# Patient Record
Sex: Female | Born: 1945 | Race: White | Hispanic: No | Smoking: Never smoker
Health system: Southern US, Community
[De-identification: ages and names within clinical notes are randomized; demographics above are authoritative.]

## PROBLEM LIST (undated history)

## (undated) DIAGNOSIS — I10 Essential (primary) hypertension: Secondary | ICD-10-CM

## (undated) DIAGNOSIS — I350 Nonrheumatic aortic (valve) stenosis: Secondary | ICD-10-CM

## (undated) DIAGNOSIS — I480 Paroxysmal atrial fibrillation: Secondary | ICD-10-CM

## (undated) DIAGNOSIS — I499 Cardiac arrhythmia, unspecified: Secondary | ICD-10-CM

## (undated) DIAGNOSIS — Z8673 Personal history of transient ischemic attack (TIA), and cerebral infarction without residual deficits: Secondary | ICD-10-CM

## (undated) DIAGNOSIS — R011 Cardiac murmur, unspecified: Secondary | ICD-10-CM

## (undated) DIAGNOSIS — G3184 Mild cognitive impairment, so stated: Secondary | ICD-10-CM

## (undated) HISTORY — PX: BREAST BIOPSY: SHX20

## (undated) HISTORY — PX: STOMACH SURGERY: SHX791

## (undated) HISTORY — PX: HAND SURGERY: SHX662

## (undated) HISTORY — PX: TONSILLECTOMY: SUR1361

## (undated) HISTORY — PX: ABDOMINAL HYSTERECTOMY: SHX81

---

## 1997-08-21 ENCOUNTER — Other Ambulatory Visit: Admission: RE | Admit: 1997-08-21 | Discharge: 1997-08-21 | Payer: Self-pay | Admitting: *Deleted

## 1997-09-04 ENCOUNTER — Ambulatory Visit (HOSPITAL_COMMUNITY): Admission: RE | Admit: 1997-09-04 | Discharge: 1997-09-04 | Payer: Self-pay | Admitting: *Deleted

## 1998-02-23 ENCOUNTER — Emergency Department (HOSPITAL_COMMUNITY): Admission: EM | Admit: 1998-02-23 | Discharge: 1998-02-24 | Payer: Self-pay | Admitting: Emergency Medicine

## 1999-12-11 ENCOUNTER — Ambulatory Visit (HOSPITAL_COMMUNITY): Admission: RE | Admit: 1999-12-11 | Discharge: 1999-12-11 | Payer: Self-pay | Admitting: *Deleted

## 1999-12-11 ENCOUNTER — Encounter: Payer: Self-pay | Admitting: *Deleted

## 2000-11-09 ENCOUNTER — Encounter (INDEPENDENT_AMBULATORY_CARE_PROVIDER_SITE_OTHER): Payer: Self-pay | Admitting: Specialist

## 2000-11-09 ENCOUNTER — Ambulatory Visit (HOSPITAL_COMMUNITY): Admission: RE | Admit: 2000-11-09 | Discharge: 2000-11-09 | Payer: Self-pay | Admitting: Gastroenterology

## 2004-02-23 ENCOUNTER — Ambulatory Visit (HOSPITAL_COMMUNITY): Admission: RE | Admit: 2004-02-23 | Discharge: 2004-02-23 | Payer: Self-pay | Admitting: Internal Medicine

## 2004-04-08 ENCOUNTER — Encounter: Admission: RE | Admit: 2004-04-08 | Discharge: 2004-04-08 | Payer: Self-pay | Admitting: Internal Medicine

## 2004-07-18 ENCOUNTER — Encounter: Admission: RE | Admit: 2004-07-18 | Discharge: 2004-07-18 | Payer: Self-pay | Admitting: Internal Medicine

## 2005-04-03 ENCOUNTER — Ambulatory Visit (HOSPITAL_COMMUNITY): Admission: RE | Admit: 2005-04-03 | Discharge: 2005-04-03 | Payer: Self-pay | Admitting: Internal Medicine

## 2006-05-08 ENCOUNTER — Ambulatory Visit (HOSPITAL_COMMUNITY): Admission: RE | Admit: 2006-05-08 | Discharge: 2006-05-08 | Payer: Self-pay | Admitting: Internal Medicine

## 2007-07-27 ENCOUNTER — Ambulatory Visit (HOSPITAL_COMMUNITY): Admission: RE | Admit: 2007-07-27 | Discharge: 2007-07-27 | Payer: Self-pay | Admitting: Internal Medicine

## 2009-02-13 ENCOUNTER — Encounter: Admission: RE | Admit: 2009-02-13 | Discharge: 2009-02-13 | Payer: Self-pay | Admitting: Family Medicine

## 2009-04-09 ENCOUNTER — Encounter: Admission: RE | Admit: 2009-04-09 | Discharge: 2009-04-09 | Payer: Self-pay | Admitting: Internal Medicine

## 2009-09-06 ENCOUNTER — Ambulatory Visit (HOSPITAL_COMMUNITY): Admission: RE | Admit: 2009-09-06 | Discharge: 2009-09-06 | Payer: Self-pay | Admitting: Internal Medicine

## 2009-10-10 ENCOUNTER — Encounter: Admission: RE | Admit: 2009-10-10 | Discharge: 2009-10-10 | Payer: Self-pay | Admitting: Internal Medicine

## 2010-06-14 NOTE — Procedures (Signed)
[facility]  Patient:    Julia Bowman, Julia Bowman Visit Number: [id number] MRN: [id number]          Service Type: Attending:  Verlin Grills, M.D. Proc. Date: 11/09/00   CC:         Thora Lance, M.D.   Procedure Report  PROCEDURE:  Colonoscopy with polypectomy.  REFERRING PHYSICIAN:  Thora Lance, M.D.  PROCEDURE INDICATION:  Ms. Julia Bowman (date of birth Jun 01, 2045 ) is a 65 year old female.  She is referred for her first surveillance colonoscopy with polypectomy to prevent colon cancer.  Her mother was diagnoses with colon cancer in her late 83s.  I discussed with Ms. Julia Bowman the complications associated with colonoscopy and polypectomy including a 15 per 1000 risk of bleeding and 4 per 1000 risk of colon perforation requiring surgical repair.  Ms. Julia Bowman has signed the operative permit.  ENDOSCOPIST:  Verlin Grills, M.D.  PREMEDICATION:  Versed 5 mg, Demerol 70 mg.  ENDOSCOPE:  Olympus pediatric colonoscope.  DESCRIPTION OF PROCEDURE:  After obtaining informed consent, Ms. Julia Bowman was placed in the left lateral decubitus position.  I administered intravenous Versed and intravenous Demerol to achieve conscious sedation for the procedure.  The patients blood pressure, oxygen saturations, and cardiac rhythm were monitored throughout the procedure and documented in the medical record.  Anal inspection was normal.  Digital rectal exam was normal.  The Olympus pediatric video colonoscope was introduced into the rectum and advanced to the mid ascending colon.  Due to colonic loop formation, I unable to intubate the cecum for examination.  Colonic preparation for the exam today was excellent.   RECTUM:  Normal.  SIGMOID COLON AND DESCENDING COLON:  At approximately 30 cm from the anal verge, a 2 mm sessile polyp was removed with the electrocautery snare and submitted for pathological interpretation.  SPLENIC FLEXURE:   Normal.  TRANSVERSE COLON:  Normal.  HEPATIC FLEXURE:  Normal.  ASCENDING COLON:  Normal.  CECUM AND ILEOCECAL VALVE:  Not examined.  ASSESSMENT: 1. Incomplete colonoscopy; due to colonic loop formation, I was unable to exam    the cecum or ileocecal valve. 2. From the distal sigmoid colon, at approximately 30 cm from the anal verge,    a 2 mm sessile polyp was removed with the electrocautery snare.  RECOMMENDATIONS:  Ms. Julia Bowman should undergo an air-contrast barium enema to exam the right colon in approximately six weeks.  She should undergo a repeat total colon examination in approximately five years. Attending:  Verlin Grills, M.D. DD:  11/09/00 TD:  11/09/00 Job: [id number]

## 2010-10-30 ENCOUNTER — Other Ambulatory Visit (HOSPITAL_COMMUNITY): Payer: Self-pay | Admitting: Internal Medicine

## 2010-10-30 DIAGNOSIS — Z1231 Encounter for screening mammogram for malignant neoplasm of breast: Secondary | ICD-10-CM

## 2010-11-14 ENCOUNTER — Ambulatory Visit (HOSPITAL_COMMUNITY)
Admission: RE | Admit: 2010-11-14 | Discharge: 2010-11-14 | Disposition: A | Discharge status: Home / Self Care | Payer: MEDICARE | Source: Ambulatory Visit | Attending: Internal Medicine | Admitting: Internal Medicine

## 2010-11-14 DIAGNOSIS — Z1231 Encounter for screening mammogram for malignant neoplasm of breast: Secondary | ICD-10-CM | POA: Insufficient documentation

## 2014-04-17 ENCOUNTER — Other Ambulatory Visit (HOSPITAL_COMMUNITY): Payer: Self-pay | Admitting: Internal Medicine

## 2014-04-17 DIAGNOSIS — Z1231 Encounter for screening mammogram for malignant neoplasm of breast: Secondary | ICD-10-CM

## 2014-04-19 ENCOUNTER — Other Ambulatory Visit: Payer: Self-pay | Admitting: Internal Medicine

## 2014-04-19 DIAGNOSIS — Z800 Family history of malignant neoplasm of digestive organs: Secondary | ICD-10-CM

## 2014-04-19 DIAGNOSIS — Z1231 Encounter for screening mammogram for malignant neoplasm of breast: Secondary | ICD-10-CM | POA: Insufficient documentation

## 2014-04-19 DIAGNOSIS — Q438 Other specified congenital malformations of intestine: Secondary | ICD-10-CM

## 2014-04-20 ENCOUNTER — Ambulatory Visit (HOSPITAL_BASED_OUTPATIENT_CLINIC_OR_DEPARTMENT_OTHER): Payer: Medicare HMO | Admitting: Radiology

## 2014-04-20 ENCOUNTER — Ambulatory Visit (HOSPITAL_COMMUNITY)
Admission: RE | Admit: 2014-04-20 | Discharge: 2014-04-20 | Disposition: A | Discharge status: Home / Self Care | Payer: Medicare HMO | Source: Ambulatory Visit | Attending: Internal Medicine | Admitting: Internal Medicine

## 2014-04-20 ENCOUNTER — Other Ambulatory Visit (HOSPITAL_COMMUNITY): Payer: Self-pay | Admitting: Internal Medicine

## 2014-04-20 DIAGNOSIS — R011 Cardiac murmur, unspecified: Secondary | ICD-10-CM | POA: Diagnosis not present

## 2014-04-20 DIAGNOSIS — Z1231 Encounter for screening mammogram for malignant neoplasm of breast: Secondary | ICD-10-CM | POA: Diagnosis not present

## 2014-04-20 NOTE — Progress Notes (Signed)
Echocardiogram performed.  

## 2014-05-01 ENCOUNTER — Ambulatory Visit
Admission: RE | Admit: 2014-05-01 | Discharge: 2014-05-01 | Disposition: A | Discharge status: Home / Self Care | Payer: Medicare HMO | Source: Ambulatory Visit | Attending: Internal Medicine | Admitting: Internal Medicine

## 2014-05-01 DIAGNOSIS — Z800 Family history of malignant neoplasm of digestive organs: Secondary | ICD-10-CM

## 2014-05-01 DIAGNOSIS — Q438 Other specified congenital malformations of intestine: Secondary | ICD-10-CM

## 2014-12-18 DIAGNOSIS — H2513 Age-related nuclear cataract, bilateral: Secondary | ICD-10-CM | POA: Diagnosis not present

## 2015-01-08 DIAGNOSIS — R933 Abnormal findings on diagnostic imaging of other parts of digestive tract: Secondary | ICD-10-CM | POA: Diagnosis not present

## 2015-01-08 DIAGNOSIS — Z800 Family history of malignant neoplasm of digestive organs: Secondary | ICD-10-CM | POA: Diagnosis not present

## 2015-01-08 DIAGNOSIS — K5900 Constipation, unspecified: Secondary | ICD-10-CM | POA: Diagnosis not present

## 2015-01-09 ENCOUNTER — Other Ambulatory Visit: Payer: Self-pay | Admitting: Gastroenterology

## 2015-01-10 ENCOUNTER — Encounter (HOSPITAL_COMMUNITY): Payer: Self-pay | Admitting: *Deleted

## 2015-01-10 NOTE — Addendum Note (Signed)
Addended by: Taneah Masri on: 01/10/2015 08:29 AM   Modules accepted: Orders  

## 2015-01-11 ENCOUNTER — Encounter (HOSPITAL_COMMUNITY): Payer: Self-pay | Admitting: *Deleted

## 2015-01-16 ENCOUNTER — Other Ambulatory Visit: Payer: Self-pay | Admitting: Gastroenterology

## 2015-01-16 NOTE — Addendum Note (Signed)
Addended by: Arta Silence on: 01/16/2015 01:13 PM   Modules accepted: Orders

## 2015-01-17 ENCOUNTER — Ambulatory Visit (HOSPITAL_COMMUNITY): Payer: Medicare HMO | Admitting: Anesthesiology

## 2015-01-17 ENCOUNTER — Encounter (HOSPITAL_COMMUNITY): Payer: Self-pay

## 2015-01-17 ENCOUNTER — Ambulatory Visit (HOSPITAL_COMMUNITY)
Admission: RE | Admit: 2015-01-17 | Discharge: 2015-01-17 | Disposition: A | Discharge status: Home / Self Care | Payer: Medicare HMO | Source: Ambulatory Visit | Attending: Gastroenterology | Admitting: Gastroenterology

## 2015-01-17 ENCOUNTER — Encounter (HOSPITAL_COMMUNITY)
Admission: RE | Disposition: A | Discharge status: Home / Self Care | Payer: Self-pay | Source: Ambulatory Visit | Attending: Gastroenterology

## 2015-01-17 DIAGNOSIS — I10 Essential (primary) hypertension: Secondary | ICD-10-CM | POA: Insufficient documentation

## 2015-01-17 DIAGNOSIS — Z86010 Personal history of colonic polyps: Secondary | ICD-10-CM | POA: Diagnosis not present

## 2015-01-17 DIAGNOSIS — E785 Hyperlipidemia, unspecified: Secondary | ICD-10-CM | POA: Diagnosis not present

## 2015-01-17 DIAGNOSIS — K644 Residual hemorrhoidal skin tags: Secondary | ICD-10-CM | POA: Insufficient documentation

## 2015-01-17 DIAGNOSIS — R933 Abnormal findings on diagnostic imaging of other parts of digestive tract: Secondary | ICD-10-CM | POA: Diagnosis not present

## 2015-01-17 DIAGNOSIS — Q438 Other specified congenital malformations of intestine: Secondary | ICD-10-CM | POA: Insufficient documentation

## 2015-01-17 DIAGNOSIS — Z79899 Other long term (current) drug therapy: Secondary | ICD-10-CM | POA: Diagnosis not present

## 2015-01-17 DIAGNOSIS — D649 Anemia, unspecified: Secondary | ICD-10-CM | POA: Diagnosis not present

## 2015-01-17 DIAGNOSIS — E669 Obesity, unspecified: Secondary | ICD-10-CM | POA: Diagnosis not present

## 2015-01-17 DIAGNOSIS — M8580 Other specified disorders of bone density and structure, unspecified site: Secondary | ICD-10-CM | POA: Insufficient documentation

## 2015-01-17 DIAGNOSIS — D125 Benign neoplasm of sigmoid colon: Secondary | ICD-10-CM | POA: Diagnosis not present

## 2015-01-17 DIAGNOSIS — Z9884 Bariatric surgery status: Secondary | ICD-10-CM | POA: Diagnosis not present

## 2015-01-17 DIAGNOSIS — K5730 Diverticulosis of large intestine without perforation or abscess without bleeding: Secondary | ICD-10-CM | POA: Diagnosis not present

## 2015-01-17 DIAGNOSIS — Z6833 Body mass index (BMI) 33.0-33.9, adult: Secondary | ICD-10-CM | POA: Insufficient documentation

## 2015-01-17 DIAGNOSIS — Z800 Family history of malignant neoplasm of digestive organs: Secondary | ICD-10-CM | POA: Diagnosis not present

## 2015-01-17 DIAGNOSIS — K5900 Constipation, unspecified: Secondary | ICD-10-CM | POA: Insufficient documentation

## 2015-01-17 HISTORY — DX: Cardiac arrhythmia, unspecified: I49.9

## 2015-01-17 HISTORY — PX: COLONOSCOPY WITH PROPOFOL: SHX5780

## 2015-01-17 SURGERY — COLONOSCOPY WITH PROPOFOL
Anesthesia: Monitor Anesthesia Care

## 2015-01-17 MED ORDER — PROPOFOL 10 MG/ML IV BOLUS
INTRAVENOUS | Status: AC
  Filled 2015-01-17: qty 20

## 2015-01-17 MED ORDER — LACTATED RINGERS IV SOLN
INTRAVENOUS | Status: DC | PRN
  Administered 2015-01-17: 09:00:00 via INTRAVENOUS

## 2015-01-17 MED ORDER — SODIUM CHLORIDE 0.9 % IV SOLN: INTRAVENOUS | Status: DC

## 2015-01-17 MED ORDER — PROPOFOL 10 MG/ML IV BOLUS
INTRAVENOUS | Status: DC | PRN
  Administered 2015-01-17 (×5): 20 mg via INTRAVENOUS
  Administered 2015-01-17: 10 mg via INTRAVENOUS
  Administered 2015-01-17: 20 mg via INTRAVENOUS
  Administered 2015-01-17: 30 mg via INTRAVENOUS
  Administered 2015-01-17 (×27): 20 mg via INTRAVENOUS

## 2015-01-17 SURGICAL SUPPLY — 22 items

## 2015-01-17 NOTE — Anesthesia Preprocedure Evaluation (Signed)
Anesthesia Evaluation  Patient identified by MRN, date of birth, ID band Patient awake    Reviewed: Allergy & Precautions, NPO status , Patient's Chart, lab work & pertinent test results  Airway Mallampati: II  TM Distance: >3 FB Neck ROM: Full    Dental no notable dental hx.    Pulmonary neg pulmonary ROS,    Pulmonary exam normal breath sounds clear to auscultation       Cardiovascular negative cardio ROS Normal cardiovascular exam+ dysrhythmias  Rhythm:Regular Rate:Normal     Neuro/Psych negative neurological ROS  negative psych ROS   GI/Hepatic negative GI ROS, Neg liver ROS,   Endo/Other  negative endocrine ROS  Renal/GU negative Renal ROS  negative genitourinary   Musculoskeletal negative musculoskeletal ROS (+)   Abdominal   Peds negative pediatric ROS (+)  Hematology negative hematology ROS (+)   Anesthesia Other Findings   Reproductive/Obstetrics negative OB ROS                             Anesthesia Physical Anesthesia Plan  ASA: II  Anesthesia Plan: MAC   Post-op Pain Management:    Induction: Intravenous  Airway Management Planned: Natural Airway  Additional Equipment:   Intra-op Plan:   Post-operative Plan: Extubation in OR  Informed Consent: I have reviewed the patients History and Physical, chart, labs and discussed the procedure including the risks, benefits and alternatives for the proposed anesthesia with the patient or authorized representative who has indicated his/her understanding and acceptance.   Dental advisory given  Plan Discussed with: CRNA  Anesthesia Plan Comments:         Anesthesia Quick Evaluation

## 2015-01-17 NOTE — H&P (Signed)
Patient interval history reviewed.  Patient examined again.  There has been no change from documented H/P dated 01/08/15 (scanned into chart from our office) except as documented above.  Assessment:  1.  Abnormal CT colonography (sigmoid colon polyp; elongated and redundant sigmoid colon). 2.  Family history of colon cancer (mother). 3.  Personal history of colon polyps.  Plan:  1.  Colonoscopy (with availability of ultraslim colonoscope). 2.  Risks (bleeding, infection, bowel perforation that could require surgery, sedation-related changes in cardiopulmonary systems), benefits (identification and possible treatment of source of symptoms, exclusion of certain causes of symptoms), and alternatives (watchful waiting, radiographic imaging studies, empiric medical treatment) of colonoscopy were explained to patient/family in detail and patient wishes to proceed.

## 2015-01-17 NOTE — Transfer of Care (Signed)
Immediate Anesthesia Transfer of Care Note  Patient: Julia Bowman  Procedure(s) Performed: Procedure(s): COLONOSCOPY WITH PROPOFOL (N/A)  Patient Location: PACU  Anesthesia Type:MAC  Level of Consciousness: sedated  Airway & Oxygen Therapy: Patient Spontanous Breathing and Patient connected to nasal cannula oxygen  Post-op Assessment: Report given to RN and Post -op Vital signs reviewed and stable  Post vital signs: Reviewed and stable  Last Vitals:  Filed Vitals:   01/17/15 0913  BP: 130/99  Pulse: 79  Temp: 36.7 C  Resp: 10    Complications: No apparent anesthesia complications

## 2015-01-17 NOTE — Op Note (Addendum)
[facility] [place], [id number]   COLONOSCOPY PROCEDURE REPORT  PATIENT: Julia Bowman, Julia Bowman  MR#: [id number] BIRTHDATE: [birth year] , 47  yrs. old GENDER: female ENDOSCOPIST: Arta Silence, MD REFERRED AY:[id number] Laurann Montana, M.D. PROCEDURE DATE:  11-Feb-2015 PROCEDURE:   Colonoscopy with snare polypectomy ASA CLASS:   Class II INDICATIONS:abnormal CT colonography (polyp sigmoid colon); personal history colonic polyps; family history colon cancer (mother). MEDICATIONS: Monitored anesthesia care  DESCRIPTION OF PROCEDURE:   After the risks benefits and alternatives of the procedure were thoroughly explained, informed consent was obtained.  revealed external hemorrhoids.   The ultraslim  colonoscope was introduced through the anus and advanced to the cecum, which was identified by both the appendix and ileocecal valve. No adverse events experienced.   The quality of the prep was good.  The instrument was then slowly withdrawn as the colon was fully examined. Estimated blood loss is zero unless otherwise noted in this procedure report.  Findings:  External hemorrhoids, otherwise normal digital rectal exam.  Extensive sigmoid diverticulosis.  Difficult colonoscopy due to looping, but with sandwich pressure on the right flank, the cecum was eventually reached.  93mm sessile sigmoid colon polyp, removed with snare cautery.  60mm  pedunculated sigmoid colon polyp, likely corresponding to lesion found on CT colonography, removed with snare and polypectomy base reapposed with hemoclip. Otherwise normal colonoscopy; no other polyps, masses, vascular ectasias, or inflammatory changes were seen.  Normal retroflexed view of rectum.          Withdrawal time was over 10 minutes     . The scope was withdrawn and the procedure completed.  COMPLICATIONS: None immediate.  ENDOSCOPIC IMPRESSION:     As above.  Sigmoid colon polyp removal x 2.  RECOMMENDATIONS:     1.   Watch for potential complications of procedure. 2.  No ASA/NSAIDs x 5 days post-polypectomy. 3.  Await polypectomy results. 4.  Repeat colonoscopy in 3-5 years, pending polyp pathology findings, would do in hospital setting with availability of ultraslim colonoscope. 5.  Follow-up with [facility] in the interim on an as-needed basis.  eSigned:  Arta Silence, MD February 11, 2015 10:41 AM Revised: 2015/02/11 10:41 AM  cc: CPT CODES: ICD CODES:

## 2015-01-17 NOTE — Discharge Instructions (Signed)
Colonoscopy  Post procedure instructions:  Read the instructions outlined below and refer to this sheet in the next few weeks. These discharge instructions provide you with general information on caring for yourself after you leave the hospital. Your doctor may also give you specific instructions. While your treatment has been planned according to the most current medical practices available, unavoidable complications occasionally occur. If you have any problems or questions after discharge, call Dr. Paulita Fujita at [facility] [phone]).  HOME CARE INSTRUCTIONS  ACTIVITY:  You may resume your regular activity, but move at a slower pace for the next 24 hours.   Take frequent rest periods for the next 24 hours.   Walking will help get rid of the air and reduce the bloated feeling in your belly (abdomen).   No driving for 24 hours (because of the medicine (anesthesia) used during the test).   You may shower.   Do not sign any important legal documents or operate any machinery for 24 hours (because of the anesthesia used during the test).  NUTRITION:  Drink plenty of fluids.   You may resume your normal diet as instructed by your doctor.   Begin with a light meal and progress to your normal diet. Heavy or fried foods are harder to digest and may make you feel sick to your stomach (nauseated).   Avoid alcoholic beverages for 24 hours or as instructed.  MEDICATIONS:  Avoid antiinflammatory medications (aspirin, motrin, ibuprofen, alleve, naproxen, BC powders, Goody's powders, Excedrin, etc.) for 5 days.  Otherwise you may resume your normal medications unless your doctor tells you otherwise.  WHAT TO EXPECT TODAY:  Some feelings of bloating in the abdomen.   Passage of more gas than usual.   Spotting of blood in your stool or on the toilet paper.  IF YOU HAD POLYPS REMOVED DURING THE COLONOSCOPY:  No aspirin products for 7 days or as instructed.   No alcohol for 7 days  or as instructed.   Eat a soft diet for the next 24 hours.   FINDING OUT THE RESULTS OF YOUR TEST  Not all test results are available during your visit. If your test results are not back during the visit, make an appointment with your caregiver to find out the results. Do not assume everything is normal if you have not heard from your caregiver or the medical facility. It is important for you to follow up on all of your test results.     SEEK IMMEDIATE MEDICAL CARE IF:   You have more than a spotting of blood in your stool.   Your belly is swollen (abdominal distention).   You are nauseated or vomiting.   You have a fever.   You have abdominal pain or discomfort that is severe or gets worse throughout the day.      Moderate Conscious Sedation, Adult, Care After Refer to this sheet in the next few weeks. These instructions provide you with information on caring for yourself after your procedure. Your health care provider may also give you more specific instructions. Your treatment has been planned according to current medical practices, but problems sometimes occur. Call your health care provider if you have any problems or questions after your procedure. WHAT TO EXPECT AFTER THE PROCEDURE  After your procedure:  You may feel sleepy, clumsy, and have poor balance for several hours.  Vomiting may occur if you eat too soon after the procedure. HOME CARE INSTRUCTIONS  Do not participate in any activities  where you could become injured for at least 24 hours. Do not:  Drive.  Swim.  Ride a bicycle.  Operate heavy machinery.  Cook.  Use power tools.  Climb ladders.  Work from a high place.  Do not make important decisions or sign legal documents until you are improved.  If you vomit, drink water, juice, or soup when you can drink without vomiting. Make sure you have little or no nausea before eating solid foods.  Only take over-the-counter or prescription medicines  for pain, discomfort, or fever as directed by your health care provider.  Make sure you and your family fully understand everything about the medicines given to you, including what side effects may occur.  You should not drink alcohol, take sleeping pills, or take medicines that cause drowsiness for at least 24 hours.  If you smoke, do not smoke without supervision.  If you are feeling better, you may resume normal activities 24 hours after you were sedated.  Keep all appointments with your health care provider. SEEK MEDICAL CARE IF:  Your skin is pale or bluish in color.  You continue to feel nauseous or vomit.  Your pain is getting worse and is not helped by medicine.  You have bleeding or swelling.  You are still sleepy or feeling clumsy after 24 hours. SEEK IMMEDIATE MEDICAL CARE IF:  You develop a rash.  You have difficulty breathing.  You develop any type of allergic problem.  You have a fever. MAKE SURE YOU:  Understand these instructions.  Will watch your condition.  Will get help right away if you are not doing well or get worse.   This information is not intended to replace advice given to you by your health care provider. Make sure you discuss any questions you have with your health care provider.   Document Released: 11/03/2012 Document Revised: 02/03/2014 Document Reviewed: 11/03/2012 Elsevier Interactive Patient Education 2016 [place] Released: 08/28/2003 Document Revised: 09/25/2010 Document Reviewed: 08/26/2007 [facility] Patient Information 2012 [place], [place].

## 2015-01-17 NOTE — Anesthesia Postprocedure Evaluation (Signed)
Anesthesia Post Note  Patient: Julia Bowman  Procedure(s) Performed: Procedure(s) (LRB): COLONOSCOPY WITH PROPOFOL (N/A)  Patient location during evaluation: PACU Anesthesia Type: MAC Level of consciousness: awake and alert Pain management: pain level controlled Vital Signs Assessment: post-procedure vital signs reviewed and stable Respiratory status: spontaneous breathing, nonlabored ventilation, respiratory function stable and patient connected to nasal cannula oxygen Cardiovascular status: stable and blood pressure returned to baseline Anesthetic complications: no    Last Vitals:  Filed Vitals:   01/17/15 1055 01/17/15 1100  BP:  134/71  Pulse:    Temp:    Resp: 17 16    Last Pain: There were no vitals filed for this visit.               Cicely Ortner J

## 2015-01-18 ENCOUNTER — Encounter (HOSPITAL_COMMUNITY): Payer: Self-pay | Admitting: Gastroenterology

## 2015-04-20 DIAGNOSIS — E669 Obesity, unspecified: Secondary | ICD-10-CM | POA: Diagnosis not present

## 2015-04-20 DIAGNOSIS — E611 Iron deficiency: Secondary | ICD-10-CM | POA: Diagnosis not present

## 2015-04-20 DIAGNOSIS — E538 Deficiency of other specified B group vitamins: Secondary | ICD-10-CM | POA: Diagnosis not present

## 2015-04-20 DIAGNOSIS — M8588 Other specified disorders of bone density and structure, other site: Secondary | ICD-10-CM | POA: Diagnosis not present

## 2015-04-20 DIAGNOSIS — Z6831 Body mass index (BMI) 31.0-31.9, adult: Secondary | ICD-10-CM | POA: Diagnosis not present

## 2015-04-20 DIAGNOSIS — Z1389 Encounter for screening for other disorder: Secondary | ICD-10-CM | POA: Diagnosis not present

## 2015-04-20 DIAGNOSIS — Z0000 Encounter for general adult medical examination without abnormal findings: Secondary | ICD-10-CM | POA: Diagnosis not present

## 2015-06-06 DIAGNOSIS — M859 Disorder of bone density and structure, unspecified: Secondary | ICD-10-CM | POA: Diagnosis not present

## 2015-06-06 DIAGNOSIS — M8589 Other specified disorders of bone density and structure, multiple sites: Secondary | ICD-10-CM | POA: Diagnosis not present

## 2015-06-26 DIAGNOSIS — M810 Age-related osteoporosis without current pathological fracture: Secondary | ICD-10-CM | POA: Diagnosis not present

## 2015-06-26 DIAGNOSIS — M8580 Other specified disorders of bone density and structure, unspecified site: Secondary | ICD-10-CM | POA: Diagnosis not present

## 2015-06-26 DIAGNOSIS — M8588 Other specified disorders of bone density and structure, other site: Secondary | ICD-10-CM | POA: Diagnosis not present

## 2015-06-28 DIAGNOSIS — M8580 Other specified disorders of bone density and structure, unspecified site: Secondary | ICD-10-CM | POA: Diagnosis not present

## 2015-07-26 DIAGNOSIS — H532 Diplopia: Secondary | ICD-10-CM | POA: Diagnosis not present

## 2015-08-28 DIAGNOSIS — H2513 Age-related nuclear cataract, bilateral: Secondary | ICD-10-CM | POA: Diagnosis not present

## 2015-09-05 DIAGNOSIS — H2512 Age-related nuclear cataract, left eye: Secondary | ICD-10-CM | POA: Diagnosis not present

## 2015-09-05 DIAGNOSIS — H25812 Combined forms of age-related cataract, left eye: Secondary | ICD-10-CM | POA: Diagnosis not present

## 2015-09-25 ENCOUNTER — Other Ambulatory Visit: Payer: Self-pay | Admitting: Internal Medicine

## 2015-09-25 DIAGNOSIS — Z1231 Encounter for screening mammogram for malignant neoplasm of breast: Secondary | ICD-10-CM

## 2015-09-25 DIAGNOSIS — I358 Other nonrheumatic aortic valve disorders: Secondary | ICD-10-CM | POA: Diagnosis not present

## 2015-09-25 DIAGNOSIS — Z124 Encounter for screening for malignant neoplasm of cervix: Secondary | ICD-10-CM | POA: Diagnosis not present

## 2015-09-25 DIAGNOSIS — L900 Lichen sclerosus et atrophicus: Secondary | ICD-10-CM | POA: Diagnosis not present

## 2015-09-27 DIAGNOSIS — Z0000 Encounter for general adult medical examination without abnormal findings: Secondary | ICD-10-CM | POA: Diagnosis not present

## 2015-09-27 DIAGNOSIS — Z6832 Body mass index (BMI) 32.0-32.9, adult: Secondary | ICD-10-CM | POA: Diagnosis not present

## 2015-09-28 ENCOUNTER — Ambulatory Visit
Admission: RE | Admit: 2015-09-28 | Discharge: 2015-09-28 | Disposition: A | Discharge status: Home / Self Care | Payer: Medicare HMO | Source: Ambulatory Visit | Attending: Internal Medicine | Admitting: Internal Medicine

## 2015-09-28 DIAGNOSIS — Z1231 Encounter for screening mammogram for malignant neoplasm of breast: Secondary | ICD-10-CM | POA: Diagnosis not present

## 2015-10-17 DIAGNOSIS — H25811 Combined forms of age-related cataract, right eye: Secondary | ICD-10-CM | POA: Diagnosis not present

## 2015-10-17 DIAGNOSIS — H2511 Age-related nuclear cataract, right eye: Secondary | ICD-10-CM | POA: Diagnosis not present

## 2015-10-31 DIAGNOSIS — R69 Illness, unspecified: Secondary | ICD-10-CM | POA: Diagnosis not present

## 2016-02-22 DIAGNOSIS — R011 Cardiac murmur, unspecified: Secondary | ICD-10-CM | POA: Diagnosis not present

## 2016-02-22 DIAGNOSIS — E669 Obesity, unspecified: Secondary | ICD-10-CM | POA: Diagnosis not present

## 2016-02-22 DIAGNOSIS — D519 Vitamin B12 deficiency anemia, unspecified: Secondary | ICD-10-CM | POA: Diagnosis not present

## 2016-02-22 DIAGNOSIS — Z6832 Body mass index (BMI) 32.0-32.9, adult: Secondary | ICD-10-CM | POA: Diagnosis not present

## 2016-02-22 DIAGNOSIS — K649 Unspecified hemorrhoids: Secondary | ICD-10-CM | POA: Diagnosis not present

## 2016-02-22 DIAGNOSIS — N7689 Other specified inflammation of vagina and vulva: Secondary | ICD-10-CM | POA: Diagnosis not present

## 2016-02-22 DIAGNOSIS — Z974 Presence of external hearing-aid: Secondary | ICD-10-CM | POA: Diagnosis not present

## 2016-02-22 DIAGNOSIS — H9113 Presbycusis, bilateral: Secondary | ICD-10-CM | POA: Diagnosis not present

## 2016-02-22 DIAGNOSIS — Z9181 History of falling: Secondary | ICD-10-CM | POA: Diagnosis not present

## 2016-02-22 DIAGNOSIS — Z0000 Encounter for general adult medical examination without abnormal findings: Secondary | ICD-10-CM | POA: Diagnosis not present

## 2016-04-21 DIAGNOSIS — Z0000 Encounter for general adult medical examination without abnormal findings: Secondary | ICD-10-CM | POA: Diagnosis not present

## 2016-04-21 DIAGNOSIS — Z7189 Other specified counseling: Secondary | ICD-10-CM | POA: Diagnosis not present

## 2016-04-21 DIAGNOSIS — Z1389 Encounter for screening for other disorder: Secondary | ICD-10-CM | POA: Diagnosis not present

## 2016-04-21 DIAGNOSIS — E611 Iron deficiency: Secondary | ICD-10-CM | POA: Diagnosis not present

## 2016-04-21 DIAGNOSIS — I350 Nonrheumatic aortic (valve) stenosis: Secondary | ICD-10-CM | POA: Diagnosis not present

## 2016-10-21 DIAGNOSIS — Z23 Encounter for immunization: Secondary | ICD-10-CM | POA: Diagnosis not present

## 2016-12-29 DIAGNOSIS — Z961 Presence of intraocular lens: Secondary | ICD-10-CM | POA: Diagnosis not present

## 2017-04-24 DIAGNOSIS — Z13820 Encounter for screening for osteoporosis: Secondary | ICD-10-CM | POA: Diagnosis not present

## 2017-04-24 DIAGNOSIS — M8580 Other specified disorders of bone density and structure, unspecified site: Secondary | ICD-10-CM | POA: Diagnosis not present

## 2017-04-24 DIAGNOSIS — I519 Heart disease, unspecified: Secondary | ICD-10-CM | POA: Diagnosis not present

## 2017-04-24 DIAGNOSIS — Z7189 Other specified counseling: Secondary | ICD-10-CM | POA: Diagnosis not present

## 2017-04-24 DIAGNOSIS — L900 Lichen sclerosus et atrophicus: Secondary | ICD-10-CM | POA: Diagnosis not present

## 2017-04-24 DIAGNOSIS — Z1389 Encounter for screening for other disorder: Secondary | ICD-10-CM | POA: Diagnosis not present

## 2017-04-24 DIAGNOSIS — I350 Nonrheumatic aortic (valve) stenosis: Secondary | ICD-10-CM | POA: Diagnosis not present

## 2017-04-24 DIAGNOSIS — Z0000 Encounter for general adult medical examination without abnormal findings: Secondary | ICD-10-CM | POA: Diagnosis not present

## 2017-04-24 DIAGNOSIS — E669 Obesity, unspecified: Secondary | ICD-10-CM | POA: Diagnosis not present

## 2017-04-27 DIAGNOSIS — R030 Elevated blood-pressure reading, without diagnosis of hypertension: Secondary | ICD-10-CM | POA: Diagnosis not present

## 2017-04-27 DIAGNOSIS — E669 Obesity, unspecified: Secondary | ICD-10-CM | POA: Diagnosis not present

## 2017-04-27 DIAGNOSIS — L309 Dermatitis, unspecified: Secondary | ICD-10-CM | POA: Diagnosis not present

## 2017-04-27 DIAGNOSIS — D649 Anemia, unspecified: Secondary | ICD-10-CM | POA: Diagnosis not present

## 2017-04-27 DIAGNOSIS — R32 Unspecified urinary incontinence: Secondary | ICD-10-CM | POA: Diagnosis not present

## 2017-04-27 DIAGNOSIS — K5900 Constipation, unspecified: Secondary | ICD-10-CM | POA: Diagnosis not present

## 2017-06-10 ENCOUNTER — Other Ambulatory Visit: Payer: Self-pay | Admitting: Internal Medicine

## 2017-06-10 DIAGNOSIS — Z1231 Encounter for screening mammogram for malignant neoplasm of breast: Secondary | ICD-10-CM

## 2017-06-23 DIAGNOSIS — M8588 Other specified disorders of bone density and structure, other site: Secondary | ICD-10-CM | POA: Diagnosis not present

## 2017-06-29 DIAGNOSIS — Z961 Presence of intraocular lens: Secondary | ICD-10-CM | POA: Diagnosis not present

## 2017-07-01 ENCOUNTER — Ambulatory Visit: Payer: Medicare HMO

## 2017-07-17 ENCOUNTER — Ambulatory Visit
Admission: RE | Admit: 2017-07-17 | Discharge: 2017-07-17 | Disposition: A | Discharge status: Home / Self Care | Payer: Medicare HMO | Source: Ambulatory Visit | Attending: Internal Medicine | Admitting: Internal Medicine

## 2017-07-17 DIAGNOSIS — Z1231 Encounter for screening mammogram for malignant neoplasm of breast: Secondary | ICD-10-CM

## 2018-06-17 DIAGNOSIS — E611 Iron deficiency: Secondary | ICD-10-CM | POA: Diagnosis not present

## 2018-06-17 DIAGNOSIS — Z111 Encounter for screening for respiratory tuberculosis: Secondary | ICD-10-CM | POA: Diagnosis not present

## 2018-06-17 DIAGNOSIS — E538 Deficiency of other specified B group vitamins: Secondary | ICD-10-CM | POA: Diagnosis not present

## 2018-06-17 DIAGNOSIS — R7301 Impaired fasting glucose: Secondary | ICD-10-CM | POA: Diagnosis not present

## 2018-10-14 ENCOUNTER — Other Ambulatory Visit: Payer: Self-pay | Admitting: Internal Medicine

## 2018-10-14 DIAGNOSIS — Z1231 Encounter for screening mammogram for malignant neoplasm of breast: Secondary | ICD-10-CM

## 2018-10-14 DIAGNOSIS — E669 Obesity, unspecified: Secondary | ICD-10-CM | POA: Diagnosis not present

## 2018-10-14 DIAGNOSIS — E611 Iron deficiency: Secondary | ICD-10-CM | POA: Diagnosis not present

## 2018-10-14 DIAGNOSIS — R0789 Other chest pain: Secondary | ICD-10-CM | POA: Diagnosis not present

## 2018-10-14 DIAGNOSIS — Z1389 Encounter for screening for other disorder: Secondary | ICD-10-CM | POA: Diagnosis not present

## 2018-10-14 DIAGNOSIS — M8580 Other specified disorders of bone density and structure, unspecified site: Secondary | ICD-10-CM | POA: Diagnosis not present

## 2018-10-14 DIAGNOSIS — Z0000 Encounter for general adult medical examination without abnormal findings: Secondary | ICD-10-CM | POA: Diagnosis not present

## 2018-10-14 DIAGNOSIS — Z23 Encounter for immunization: Secondary | ICD-10-CM | POA: Diagnosis not present

## 2018-10-14 DIAGNOSIS — I350 Nonrheumatic aortic (valve) stenosis: Secondary | ICD-10-CM | POA: Diagnosis not present

## 2018-10-14 DIAGNOSIS — M25562 Pain in left knee: Secondary | ICD-10-CM | POA: Diagnosis not present

## 2018-10-14 DIAGNOSIS — R7301 Impaired fasting glucose: Secondary | ICD-10-CM | POA: Diagnosis not present

## 2018-10-14 DIAGNOSIS — I519 Heart disease, unspecified: Secondary | ICD-10-CM | POA: Diagnosis not present

## 2018-10-14 DIAGNOSIS — E7800 Pure hypercholesterolemia, unspecified: Secondary | ICD-10-CM | POA: Diagnosis not present

## 2018-10-14 DIAGNOSIS — E538 Deficiency of other specified B group vitamins: Secondary | ICD-10-CM | POA: Diagnosis not present

## 2018-11-29 ENCOUNTER — Ambulatory Visit
Admission: RE | Admit: 2018-11-29 | Discharge: 2018-11-29 | Disposition: A | Discharge status: Home / Self Care | Payer: Medicare HMO | Source: Ambulatory Visit | Attending: Internal Medicine | Admitting: Internal Medicine

## 2018-11-29 ENCOUNTER — Other Ambulatory Visit: Payer: Self-pay

## 2018-11-29 DIAGNOSIS — Z1231 Encounter for screening mammogram for malignant neoplasm of breast: Secondary | ICD-10-CM | POA: Diagnosis not present

## 2019-09-14 DIAGNOSIS — Z008 Encounter for other general examination: Secondary | ICD-10-CM | POA: Diagnosis not present

## 2019-09-14 DIAGNOSIS — R030 Elevated blood-pressure reading, without diagnosis of hypertension: Secondary | ICD-10-CM | POA: Diagnosis not present

## 2019-09-14 DIAGNOSIS — Z6834 Body mass index (BMI) 34.0-34.9, adult: Secondary | ICD-10-CM | POA: Diagnosis not present

## 2019-09-14 DIAGNOSIS — E669 Obesity, unspecified: Secondary | ICD-10-CM | POA: Diagnosis not present

## 2019-09-14 DIAGNOSIS — D509 Iron deficiency anemia, unspecified: Secondary | ICD-10-CM | POA: Diagnosis not present

## 2019-10-27 DIAGNOSIS — M8580 Other specified disorders of bone density and structure, unspecified site: Secondary | ICD-10-CM | POA: Diagnosis not present

## 2019-10-27 DIAGNOSIS — E611 Iron deficiency: Secondary | ICD-10-CM | POA: Diagnosis not present

## 2019-10-27 DIAGNOSIS — D126 Benign neoplasm of colon, unspecified: Secondary | ICD-10-CM | POA: Diagnosis not present

## 2019-10-27 DIAGNOSIS — I519 Heart disease, unspecified: Secondary | ICD-10-CM | POA: Diagnosis not present

## 2019-10-27 DIAGNOSIS — Z0000 Encounter for general adult medical examination without abnormal findings: Secondary | ICD-10-CM | POA: Diagnosis not present

## 2019-10-27 DIAGNOSIS — I350 Nonrheumatic aortic (valve) stenosis: Secondary | ICD-10-CM | POA: Diagnosis not present

## 2019-10-27 DIAGNOSIS — Z23 Encounter for immunization: Secondary | ICD-10-CM | POA: Diagnosis not present

## 2019-10-27 DIAGNOSIS — E669 Obesity, unspecified: Secondary | ICD-10-CM | POA: Diagnosis not present

## 2019-10-27 DIAGNOSIS — R7301 Impaired fasting glucose: Secondary | ICD-10-CM | POA: Diagnosis not present

## 2019-10-27 DIAGNOSIS — E7800 Pure hypercholesterolemia, unspecified: Secondary | ICD-10-CM | POA: Diagnosis not present

## 2019-10-27 DIAGNOSIS — Z7189 Other specified counseling: Secondary | ICD-10-CM | POA: Diagnosis not present

## 2019-10-27 DIAGNOSIS — Z1389 Encounter for screening for other disorder: Secondary | ICD-10-CM | POA: Diagnosis not present

## 2019-11-01 ENCOUNTER — Other Ambulatory Visit: Payer: Self-pay | Admitting: Internal Medicine

## 2019-11-01 DIAGNOSIS — M8580 Other specified disorders of bone density and structure, unspecified site: Secondary | ICD-10-CM

## 2019-11-04 ENCOUNTER — Other Ambulatory Visit: Payer: Self-pay | Admitting: Internal Medicine

## 2019-11-04 DIAGNOSIS — E7800 Pure hypercholesterolemia, unspecified: Secondary | ICD-10-CM

## 2019-11-21 ENCOUNTER — Other Ambulatory Visit: Payer: Self-pay | Admitting: Internal Medicine

## 2019-11-21 DIAGNOSIS — Z1231 Encounter for screening mammogram for malignant neoplasm of breast: Secondary | ICD-10-CM

## 2019-11-22 ENCOUNTER — Ambulatory Visit
Admission: RE | Admit: 2019-11-22 | Discharge: 2019-11-22 | Disposition: A | Discharge status: Home / Self Care | Payer: No Typology Code available for payment source | Source: Ambulatory Visit | Attending: Internal Medicine | Admitting: Internal Medicine

## 2019-11-22 DIAGNOSIS — E7800 Pure hypercholesterolemia, unspecified: Secondary | ICD-10-CM

## 2019-12-21 ENCOUNTER — Ambulatory Visit: Payer: Medicare HMO

## 2020-01-03 DIAGNOSIS — E7800 Pure hypercholesterolemia, unspecified: Secondary | ICD-10-CM | POA: Diagnosis not present

## 2020-01-03 DIAGNOSIS — Z5181 Encounter for therapeutic drug level monitoring: Secondary | ICD-10-CM | POA: Diagnosis not present

## 2020-03-05 ENCOUNTER — Other Ambulatory Visit: Payer: Self-pay

## 2020-03-05 ENCOUNTER — Ambulatory Visit
Admission: RE | Admit: 2020-03-05 | Discharge: 2020-03-05 | Disposition: A | Discharge status: Home / Self Care | Payer: Medicare HMO | Source: Ambulatory Visit | Attending: Internal Medicine | Admitting: Internal Medicine

## 2020-03-05 DIAGNOSIS — Z780 Asymptomatic menopausal state: Secondary | ICD-10-CM | POA: Diagnosis not present

## 2020-03-05 DIAGNOSIS — M8580 Other specified disorders of bone density and structure, unspecified site: Secondary | ICD-10-CM

## 2020-03-05 DIAGNOSIS — Z1231 Encounter for screening mammogram for malignant neoplasm of breast: Secondary | ICD-10-CM | POA: Diagnosis not present

## 2020-03-05 DIAGNOSIS — M8588 Other specified disorders of bone density and structure, other site: Secondary | ICD-10-CM | POA: Diagnosis not present

## 2020-03-05 DIAGNOSIS — M810 Age-related osteoporosis without current pathological fracture: Secondary | ICD-10-CM | POA: Diagnosis not present

## 2020-03-15 DIAGNOSIS — M810 Age-related osteoporosis without current pathological fracture: Secondary | ICD-10-CM | POA: Diagnosis not present

## 2020-04-26 DIAGNOSIS — E785 Hyperlipidemia, unspecified: Secondary | ICD-10-CM | POA: Diagnosis not present

## 2020-04-26 DIAGNOSIS — E669 Obesity, unspecified: Secondary | ICD-10-CM | POA: Diagnosis not present

## 2020-04-26 DIAGNOSIS — D649 Anemia, unspecified: Secondary | ICD-10-CM | POA: Diagnosis not present

## 2020-04-26 DIAGNOSIS — G629 Polyneuropathy, unspecified: Secondary | ICD-10-CM | POA: Diagnosis not present

## 2020-04-26 DIAGNOSIS — Z6834 Body mass index (BMI) 34.0-34.9, adult: Secondary | ICD-10-CM | POA: Diagnosis not present

## 2020-04-26 DIAGNOSIS — R030 Elevated blood-pressure reading, without diagnosis of hypertension: Secondary | ICD-10-CM | POA: Diagnosis not present

## 2020-04-26 DIAGNOSIS — R32 Unspecified urinary incontinence: Secondary | ICD-10-CM | POA: Diagnosis not present

## 2020-04-26 DIAGNOSIS — I951 Orthostatic hypotension: Secondary | ICD-10-CM | POA: Diagnosis not present

## 2020-04-26 DIAGNOSIS — K5900 Constipation, unspecified: Secondary | ICD-10-CM | POA: Diagnosis not present

## 2020-04-26 DIAGNOSIS — M810 Age-related osteoporosis without current pathological fracture: Secondary | ICD-10-CM | POA: Diagnosis not present

## 2020-09-28 DIAGNOSIS — H6121 Impacted cerumen, right ear: Secondary | ICD-10-CM | POA: Diagnosis not present

## 2020-10-18 DIAGNOSIS — Z23 Encounter for immunization: Secondary | ICD-10-CM | POA: Diagnosis not present

## 2020-11-05 DIAGNOSIS — H906 Mixed conductive and sensorineural hearing loss, bilateral: Secondary | ICD-10-CM | POA: Diagnosis not present

## 2020-11-05 DIAGNOSIS — I350 Nonrheumatic aortic (valve) stenosis: Secondary | ICD-10-CM | POA: Diagnosis not present

## 2020-11-05 DIAGNOSIS — Z0000 Encounter for general adult medical examination without abnormal findings: Secondary | ICD-10-CM | POA: Diagnosis not present

## 2020-11-05 DIAGNOSIS — R7301 Impaired fasting glucose: Secondary | ICD-10-CM | POA: Diagnosis not present

## 2020-11-05 DIAGNOSIS — E7800 Pure hypercholesterolemia, unspecified: Secondary | ICD-10-CM | POA: Diagnosis not present

## 2020-11-05 DIAGNOSIS — M810 Age-related osteoporosis without current pathological fracture: Secondary | ICD-10-CM | POA: Diagnosis not present

## 2020-11-05 DIAGNOSIS — Z1389 Encounter for screening for other disorder: Secondary | ICD-10-CM | POA: Diagnosis not present

## 2020-11-05 DIAGNOSIS — E538 Deficiency of other specified B group vitamins: Secondary | ICD-10-CM | POA: Diagnosis not present

## 2020-11-07 ENCOUNTER — Ambulatory Visit (INDEPENDENT_AMBULATORY_CARE_PROVIDER_SITE_OTHER): Payer: Medicare HMO | Admitting: Otolaryngology

## 2020-11-07 ENCOUNTER — Other Ambulatory Visit: Payer: Self-pay

## 2020-11-07 DIAGNOSIS — H6983 Other specified disorders of Eustachian tube, bilateral: Secondary | ICD-10-CM

## 2020-11-07 DIAGNOSIS — H906 Mixed conductive and sensorineural hearing loss, bilateral: Secondary | ICD-10-CM

## 2020-11-07 DIAGNOSIS — H6123 Impacted cerumen, bilateral: Secondary | ICD-10-CM

## 2020-11-07 DIAGNOSIS — H6523 Chronic serous otitis media, bilateral: Secondary | ICD-10-CM | POA: Diagnosis not present

## 2020-11-07 MED ORDER — FLUTICASONE PROPIONATE 50 MCG/ACT NA SUSP: 2.0000 | Freq: Every day | NASAL | 6 refills | Status: AC

## 2020-11-07 NOTE — Progress Notes (Signed)
HPI: Julia Bowman is a 75 y.o. female who presents is [facility] by [facility] for evaluation of hearing loss in both ears.  She has had hearing loss for a number of years but over the past year is gotten a lot worse.  [facility] at [facility] where they performed hearing test she brings this with her to the office today.  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  Past Medical History:  Diagnosis Date   Dysrhythmia    Past Surgical History:  Procedure Laterality Date   ABDOMINAL HYSTERECTOMY     BREAST BIOPSY     COLONOSCOPY WITH PROPOFOL N/A 01/17/2015   Procedure: COLONOSCOPY WITH PROPOFOL;  Surgeon: Arta Silence, MD;  Location: [facility];  Service: Endoscopy;  Laterality: N/A;   HAND SURGERY     STOMACH SURGERY     TONSILLECTOMY     Social History   Socioeconomic History   Marital status: Married    Spouse name: Not on file   Number of children: Not on file   Years of education: Not on file   Highest education level: Not on file  Occupational History   Not on file  Tobacco Use   Smoking status: Never   Smokeless tobacco: Not on file  Substance and Sexual Activity   Alcohol use: No   Drug use: No   Sexual activity: Not on file  Other Topics Concern   Not on file  Social History Narrative   Not on file   Social Determinants of Health   Financial Resource Strain: Not on file  Food Insecurity: Not on file  Transportation Needs: Not on file  Physical Activity: Not on file  Stress: Not on file  Social Connections: Not on file   No family history on file. No Known Allergies Prior to Admission medications   Medication Sig Start Date End Date Taking? Authorizing Provider  cyanocobalamin (,VITAMIN B-12,) 1000 MCG/ML injection Inject 1,000 mcg into the skin every 30 (thirty) days.  10/04/14   [provider]  GAVILYTE-N WITH FLAVOR PACK 420 G  solution Take 4,000 mLs by mouth once.  01/09/15   [provider]  hydroxypropyl methylcellulose / hypromellose (ISOPTO TEARS / GONIOVISC) 2.5 % ophthalmic solution Place 1 drop into both eyes 4 (four) times daily as needed for dry eyes.    [provider]  Multiple Vitamin (MULTIVITAMIN WITH MINERALS) TABS tablet Take 1 tablet by mouth daily.    [provider]     Positive ROS: Otherwise negative  All other systems have been reviewed and were otherwise negative with the exception of those mentioned in the HPI and as above.  Physical Exam: Constitutional: Alert, well-appearing, no acute distress Ears: External ears without lesions or tenderness. Ear canals with minimal wax buildup on both sides that was cleaned with suction and curettes.  Of note both TMs were retracted with a very thickened TM on the right side consistent with tympanosclerosis with poor mobility on pneumatic otoscopy.  I was able to insufflate some air behind the TMs with improved hearing. Nasal: External nose without lesions. Septum with mild deformity.  Both middle meatus regions were clear with no signs of infection..  On nasal endoscopy the nasopharynx was clear.  Neither eustachian tube is obstructed. Oral: Lips and gums without lesions. Tongue and palate mucosa without lesions. Posterior oropharynx clear. Neck: No palpable adenopathy or masses Respiratory:  Breathing comfortably  Skin: No facial/neck lesions or rash noted.  Cerumen impaction removal  Date/Time: 11/07/2020 1:07 PM Performed by: Rozetta Nunnery, MD Authorized by: Rozetta Nunnery, MD   Consent:    Consent obtained:  Verbal   Consent given by:  Patient   Risks discussed:  Pain and bleeding Procedure details:    Location:  L ear and R ear   Procedure type: curette and suction   Post-procedure details:    Inspection:  TM intact and canal normal   Hearing quality:  Improved   Procedure completion:  Tolerated  well, no immediate complications Comments:     Both ear canals with minimal wax buildup that was cleaned with curette and suction.  Both TMs were retracted with serous otitis media with a very thickened right TM.  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 Serous otitis media with eustachian tube dysfunction.  Plan: Placed her on Flonase 2 sprays each nostril at night and this was sent into pharmacy in [place].  Discussed with her concerning trying to "pop" her ears. She will follow-up in a week for recheck and if the serous effusion persist consider BMTs.   Radene Journey, MD   CC:

## 2020-11-12 ENCOUNTER — Ambulatory Visit (INDEPENDENT_AMBULATORY_CARE_PROVIDER_SITE_OTHER): Payer: Medicare HMO | Admitting: Otolaryngology

## 2020-11-13 ENCOUNTER — Ambulatory Visit (INDEPENDENT_AMBULATORY_CARE_PROVIDER_SITE_OTHER): Payer: Medicare HMO | Admitting: Otolaryngology

## 2020-11-13 ENCOUNTER — Other Ambulatory Visit: Payer: Self-pay

## 2020-11-13 DIAGNOSIS — H6523 Chronic serous otitis media, bilateral: Secondary | ICD-10-CM | POA: Diagnosis not present

## 2020-11-13 DIAGNOSIS — H906 Mixed conductive and sensorineural hearing loss, bilateral: Secondary | ICD-10-CM | POA: Diagnosis not present

## 2020-11-13 MED ORDER — TRIAMCINOLONE ACETONIDE 55 MCG/ACT NA AERO: 2.0000 | INHALATION_SPRAY | Freq: Every day | NASAL | 12 refills | Status: AC

## 2020-11-13 MED ORDER — CIPROFLOXACIN-DEXAMETHASONE 0.3-0.1 % OT SUSP: 4.0000 [drp] | Freq: Two times a day (BID) | OTIC | 0 refills | Status: DC

## 2020-11-13 NOTE — Progress Notes (Signed)
HPI: Julia Bowman is a 75 y.o. 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  She has underlying sensorineural hearing loss and has noticed worsening of her hearing over the past 8 months.  On recent exam in the office she had an obvious left serous otitis media with a retracted left TM.  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  She had conductive hearing loss in both ears in addition to underlying sensorineural hearing loss.  She presents today to have tubes placed in her ears..  Past Medical History:  Diagnosis Date   Dysrhythmia    Past Surgical History:  Procedure Laterality Date   ABDOMINAL HYSTERECTOMY     BREAST BIOPSY     COLONOSCOPY WITH PROPOFOL N/A 01/17/2015   Procedure: COLONOSCOPY WITH PROPOFOL;  Surgeon: Arta Silence, MD;  Location: [facility];  Service: Endoscopy;  Laterality: N/A;   HAND SURGERY     STOMACH SURGERY     TONSILLECTOMY     Social History   Socioeconomic History   Marital status: Married    Spouse name: Not on file   Number of children: Not on file   Years of education: Not on file   Highest education level: Not on file  Occupational History   Not on file  Tobacco Use   Smoking status: Never   Smokeless tobacco: Not on file  Substance and Sexual Activity   Alcohol use: No   Drug use: No   Sexual activity: Not on file  Other Topics Concern   Not on file  Social History Narrative   Not on file   Social Determinants of Health   Financial Resource Strain: Not on file  Food Insecurity: Not on file  Transportation Needs: Not on file  Physical Activity: Not on file  Stress: Not on file  Social Connections: Not on file   No family history on file. No Known Allergies Prior to Admission medications   Medication Sig Start Date End Date Taking? Authorizing Provider   cyanocobalamin (,VITAMIN B-12,) 1000 MCG/ML injection Inject 1,000 mcg into the skin every 30 (thirty) days.  10/04/14   [provider]  fluticasone (FLONASE) 50 MCG/ACT nasal spray Place 2 sprays into both nostrils daily. 2 sprays each nostril at night 11/07/20   Rozetta Nunnery, MD  GAVILYTE-N WITH FLAVOR PACK 420 G solution Take 4,000 mLs by mouth once.  01/09/15   [provider]  hydroxypropyl methylcellulose / hypromellose (ISOPTO TEARS / GONIOVISC) 2.5 % ophthalmic solution Place 1 drop into both eyes 4 (four) times daily as needed for dry eyes.    [provider]  Multiple Vitamin (MULTIVITAMIN WITH MINERALS) TABS tablet Take 1 tablet by mouth daily.    [provider]     Positive ROS: Otherwise negative  All other systems have been reviewed and were otherwise negative with the exception of those mentioned in the HPI and as above.  Physical Exam: Constitutional: Alert, well-appearing, no acute distress Ears: External ears without lesions or tenderness. Ear canals are clear with no significant wax buildup.  The left TM was retracted with a serous middle ear effusion and the TM slightly draped over the incus.  On the right side the TM is white with tympanosclerosis and difficult to assess middle ear. Nasal: External nose without lesions. Septum with mild deformity and mild rhinitis.  Posterior nasal cavity was clear with no signs of infection.. Clear nasal passages Oral: Lips and gums without lesions. Tongue and palate mucosa without lesions. Posterior oropharynx clear. Neck: No palpable adenopathy or masses Respiratory: Breathing comfortably  Skin: No facial/neck lesions or rash noted.  Myringotomy  Date/Time: 11/13/2020 11:12 AM Performed by: Rozetta Nunnery, MD Authorized by: Rozetta Nunnery, MD   Consent:    Consent obtained:  Verbal   Consent given by:  Patient Pre-procedure details:    Indications: serous otitis media    Anesthesia:    Anesthesia method:  Topical application   Topical anesthetic:  Phenol Procedure Details:    Location:  Left TM and right TM   Hole made in:  Anteroinferior aspect of TM   Tube:  Paparella Type 1 Findings:    Fluid:  Serous fluid Comments:     On the left side myringotomy was made anterior inferiorly and a serous effusion was aspirated and a Paparella type I tube was inserted without difficulty.  On the right side she had much more tympanosclerosis making the myringotomy much more difficult.  On the right side there was minimal middle ear effusion.  A palpable type I tube was inserted followed by Ciprodex eardrops placed bilaterally.  Assessment: Bilateral mixed hearing loss with bilateral serous otitis media.  Plan: Bilateral myringotomy and tubes were performed in the office today using palpable type I tubes. Recommended Ciprodex eardrops 4 drops twice daily for the next 3 days or until drainage stops. Also prescribed Nasacort 2 sprays each nostril at night to help with eustachian tube dysfunction. She will follow-up in 6 days for recheck   Radene Journey, MD

## 2020-11-19 ENCOUNTER — Other Ambulatory Visit: Payer: Self-pay

## 2020-11-19 ENCOUNTER — Encounter (INDEPENDENT_AMBULATORY_CARE_PROVIDER_SITE_OTHER): Payer: Self-pay

## 2020-11-19 ENCOUNTER — Ambulatory Visit (INDEPENDENT_AMBULATORY_CARE_PROVIDER_SITE_OTHER): Payer: Medicare HMO | Admitting: Otolaryngology

## 2020-11-19 DIAGNOSIS — Z4889 Encounter for other specified surgical aftercare: Secondary | ICD-10-CM

## 2020-11-19 NOTE — Progress Notes (Signed)
HPI: Julia Bowman is a 75 y.o. female who presents 9 days s/p BMTs in the office with a palpable type I tube.  She is doing well with no drainage from her ears..   Past Medical History:  Diagnosis Date   Dysrhythmia    Past Surgical History:  Procedure Laterality Date   ABDOMINAL HYSTERECTOMY     BREAST BIOPSY     COLONOSCOPY WITH PROPOFOL N/A 01/17/2015   Procedure: COLONOSCOPY WITH PROPOFOL;  Surgeon: Arta Silence, MD;  Location: [facility];  Service: Endoscopy;  Laterality: N/A;   HAND SURGERY     STOMACH SURGERY     TONSILLECTOMY     Social History   Socioeconomic History   Marital status: Married    Spouse name: Not on file   Number of children: Not on file   Years of education: Not on file   Highest education level: Not on file  Occupational History   Not on file  Tobacco Use   Smoking status: Never   Smokeless tobacco: Not on file  Substance and Sexual Activity   Alcohol use: No   Drug use: No   Sexual activity: Not on file  Other Topics Concern   Not on file  Social History Narrative   Not on file   Social Determinants of Health   Financial Resource Strain: Not on file  Food Insecurity: Not on file  Transportation Needs: Not on file  Physical Activity: Not on file  Stress: Not on file  Social Connections: Not on file   No family history on file. No Known Allergies Prior to Admission medications   Medication Sig Start Date End Date Taking? Authorizing Provider  ciprofloxacin-dexamethasone (CIPRODEX) OTIC suspension Place 4 drops into both ears 2 (two) times daily. 4 drops in each ear twice a day for the next 2 days or until drainage from the ears stops. 11/13/20   Rozetta Nunnery, MD  cyanocobalamin (,VITAMIN B-12,) 1000 MCG/ML injection Inject 1,000 mcg into the skin every 30 (thirty) days.  10/04/14   [provider]  fluticasone (FLONASE) 50 MCG/ACT nasal spray Place 2 sprays into both nostrils daily. 2 sprays each nostril at night  11/07/20   Rozetta Nunnery, MD  GAVILYTE-N WITH FLAVOR PACK 420 G solution Take 4,000 mLs by mouth once.  01/09/15   [provider]  hydroxypropyl methylcellulose / hypromellose (ISOPTO TEARS / GONIOVISC) 2.5 % ophthalmic solution Place 1 drop into both eyes 4 (four) times daily as needed for dry eyes.    [provider]  Multiple Vitamin (MULTIVITAMIN WITH MINERALS) TABS tablet Take 1 tablet by mouth daily.    [provider]  triamcinolone (NASACORT) 55 MCG/ACT AERO nasal inhaler Place 2 sprays into the nose daily. 2 sprays each nostril at night 11/13/20   Rozetta Nunnery, MD     Physical Exam: Both myringotomy tubes are patent and dry.   Assessment: S/p BMTs for serous otitis media and conductive hearing loss in addition to underlying sensorineural hearing loss.  Plan: She is cleared to get hearing aids. Discussed with her concerning my retirement and she will follow-up with one of the other ENT groups if she has any further problems with her ears.   Radene Journey, MD

## 2021-01-03 IMAGING — CT CT CARDIAC CORONARY ARTERY CALCIUM SCORE
3 series · 14 of 20 positions shown, 16 images · non-contrast
Comparison: 10/10/2009 chest CT.

CLINICAL DATA: Hyperlipidemia.  Family history of heart disease.

EXAM:
CT CARDIAC CORONARY ARTERY CALCIUM SCORE
TECHNIQUE: Non-contrast imaging through the heart was performed using
prospective ECG gating. Image post processing was performed on an
independent workstation, allowing for quantitative analysis of the
heart and coronary arteries. Note that this exam targets the heart
and the chest was not imaged in its entirety.

[Series 2: calcium scoring 2.00 qr36 bestdiast 69% hrt calciu · axial · 0.38mm/px · z∈[+1681,+1765]mm · 4 of 70 slices shown]
[im 14/70  vessel]
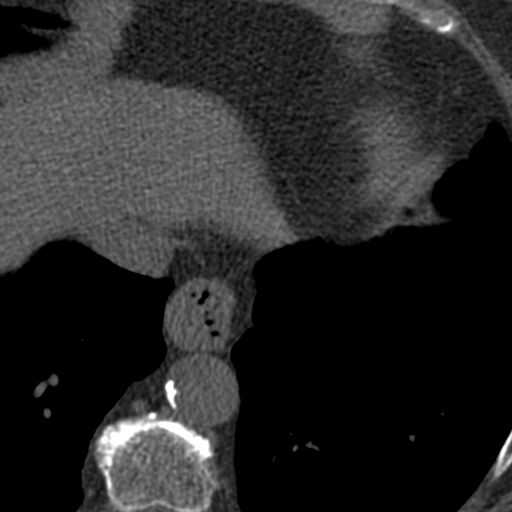
[im 28/70  vessel]
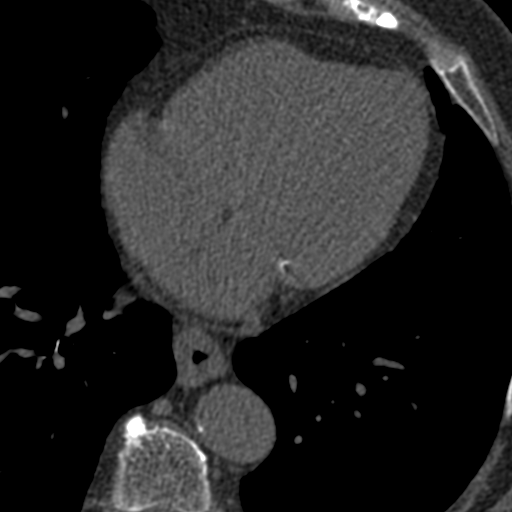
[im 42/70  vessel]
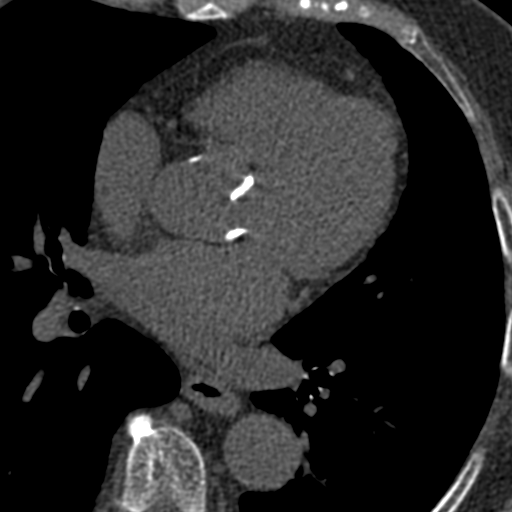
[im 56/70  vessel]
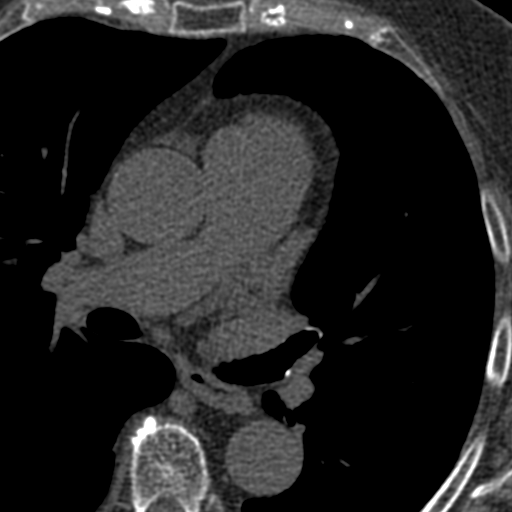

[Series 3: calcium scoring 2.00 br40 bestdiast 69% axial · axial · 0.62mm/px · z∈[+1677,+1769]mm · 5 of 70 slices shown, 7 images]
[im 12/70  vessel]
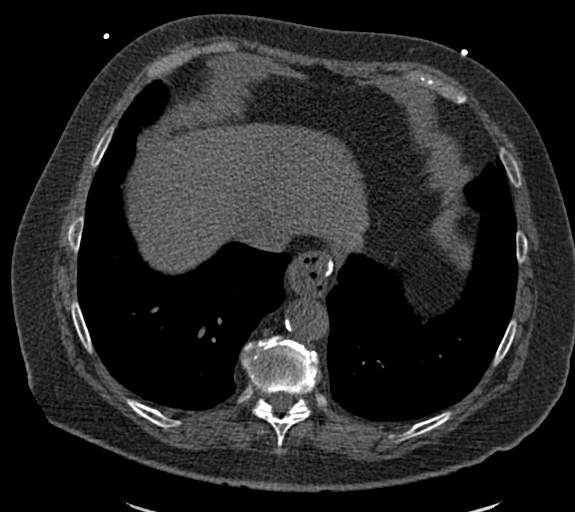
[im 12/70  lung]
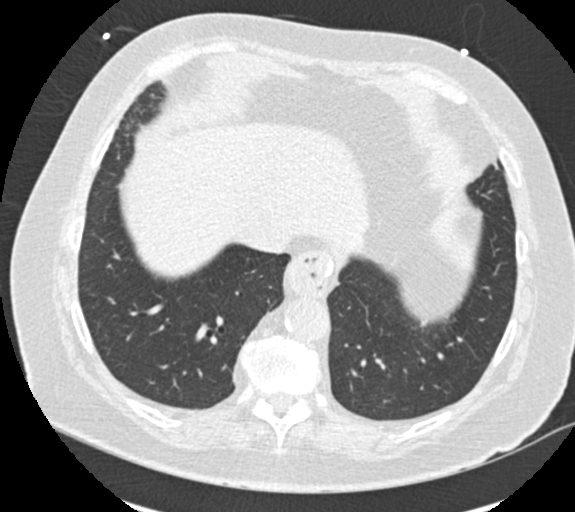
[im 24/70  vessel]
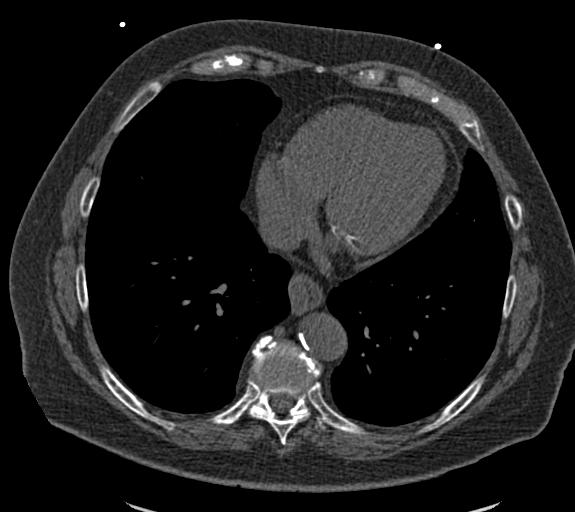
[im 35/70  vessel]
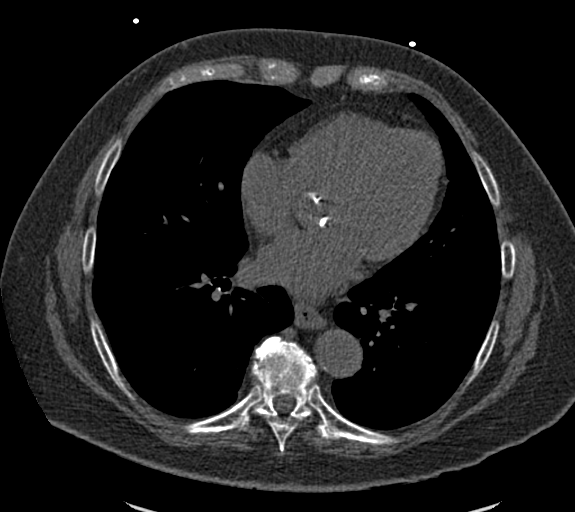
[im 47/70  vessel]
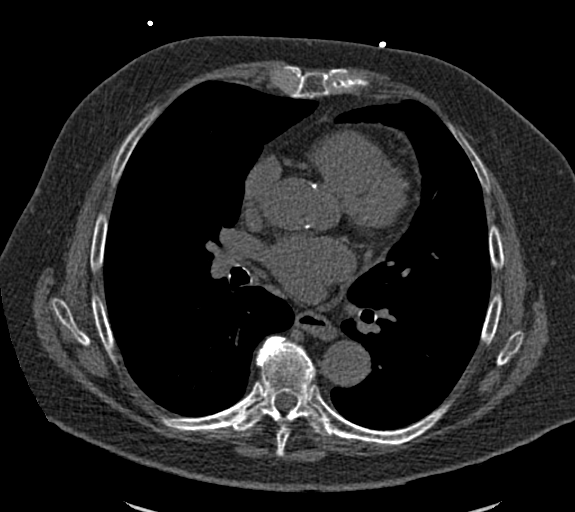
[im 58/70  vessel]
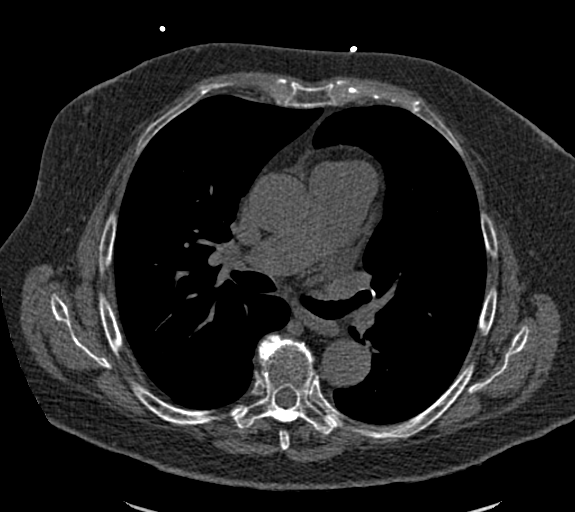
[im 58/70  lung]
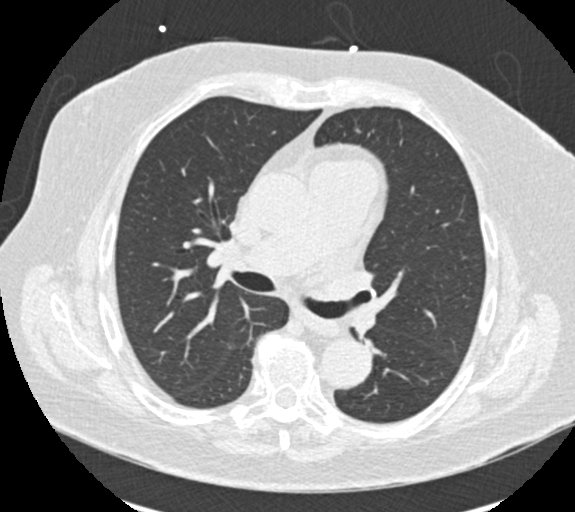

[Series 9: calcium scoring 2.00 br60 bestdiast 69% lungs · axial · 0.62mm/px · z∈[+1677,+1769]mm · 5 of 70 slices shown]
[im 12/70  vessel]
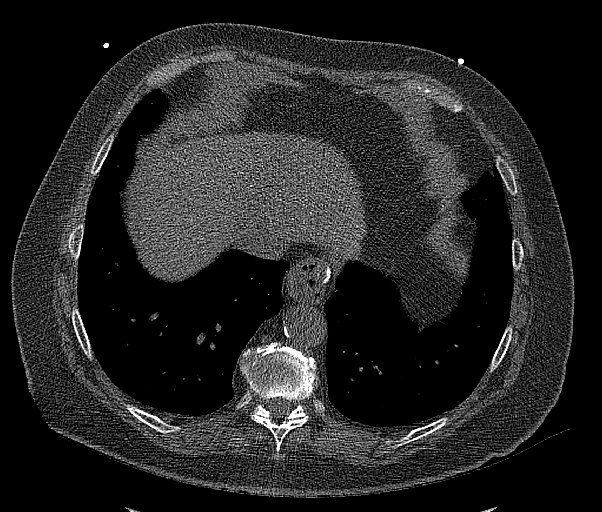
[im 24/70  vessel]
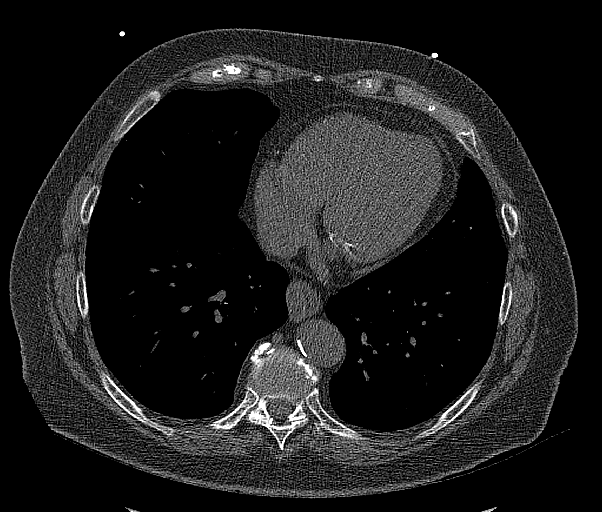
[im 35/70  vessel]
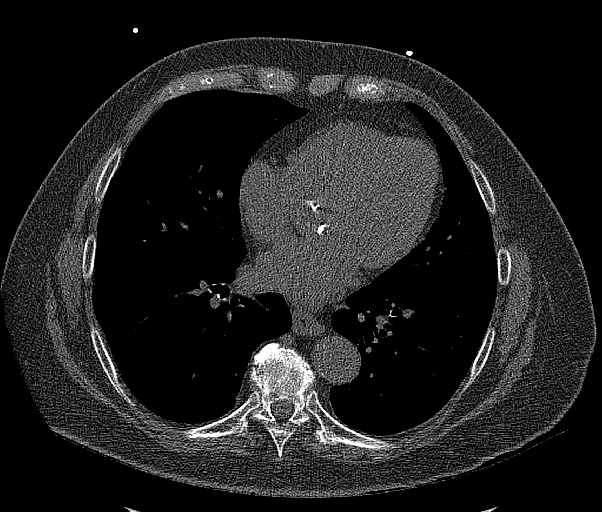
[im 47/70  vessel]
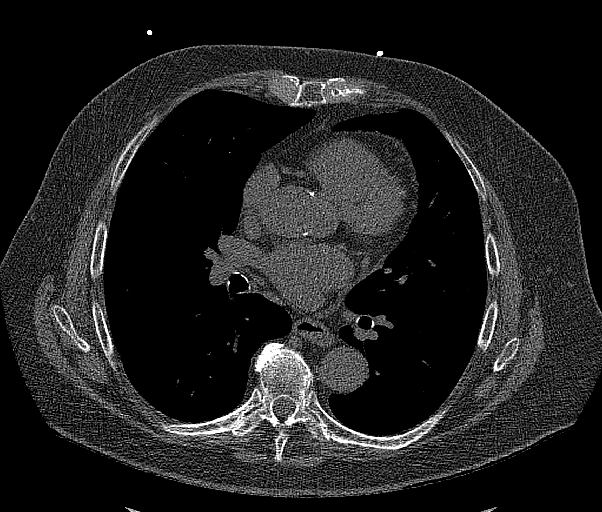
[im 58/70  vessel]
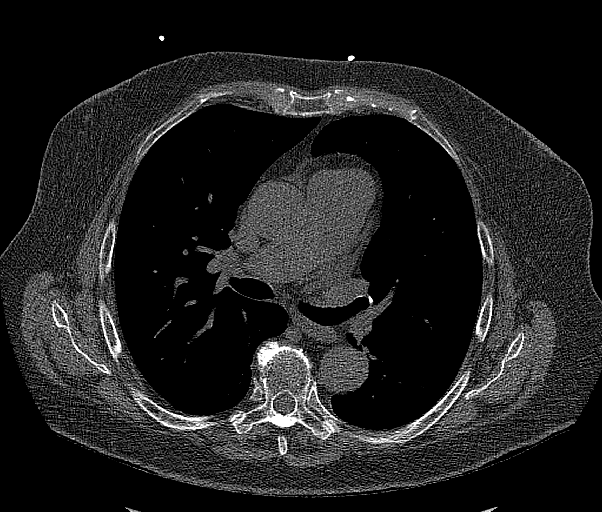

[14 of 20 positions shown; findings below may reference images not displayed]

FINDINGS: CORONARY CALCIUM SCORES:

Left Main: 0

LAD:

LCx:

RCA: 0

Total Agatston Score: 114

[HOSPITAL] percentile: 59th

AORTA MEASUREMENTS:

Ascending Aorta: 31 mm

Descending Aorta: 30 mm

OTHER FINDINGS:

No pleural fluid. Nodule along the right minor fissure on 33/9 is
similar to on the prior and can be seen presumed a benign subpleural
lymph node.

A right lower lobe 5 mm nodule on 58/9 is also similar in 7444 and
presumed benign.

Aortic atherosclerosis.  Tortuous thoracic aorta.

Normal heart size.  Aortic valve calcification.

Pulmonary artery enlargement, outflow tract 3.4 cm

No imaged thoracic adenopathy. Surgical changes at the
gastroesophageal junction. Esophageal fluid level on [DATE].

Normal imaged portions of the liver, spleen.

Osteopenia with moderate thoracic spondylosis.
IMPRESSION: 1. Total Agatston score of 114, corresponding to 59th percentile for
age, sex, and race based cohort.
2. Pulmonary artery enlargement suggests pulmonary arterial
hypertension.
3. Esophageal air fluid level suggests dysmotility or
gastroesophageal reflux.
4. Aortic Atherosclerosis ([id number]).
5. Aortic valvular calcifications. Consider echocardiography to
evaluate for valvular dysfunction.

## 2021-02-22 ENCOUNTER — Other Ambulatory Visit: Payer: Self-pay | Admitting: Gastroenterology

## 2021-03-27 ENCOUNTER — Ambulatory Visit (HOSPITAL_COMMUNITY): Admit: 2021-03-27 | Payer: Medicare HMO | Admitting: Gastroenterology

## 2021-03-27 ENCOUNTER — Encounter (HOSPITAL_COMMUNITY): Payer: Self-pay

## 2021-03-27 SURGERY — COLONOSCOPY WITH PROPOFOL
Anesthesia: Monitor Anesthesia Care

## 2021-04-01 DIAGNOSIS — K5900 Constipation, unspecified: Secondary | ICD-10-CM | POA: Diagnosis not present

## 2021-04-01 DIAGNOSIS — Z8249 Family history of ischemic heart disease and other diseases of the circulatory system: Secondary | ICD-10-CM | POA: Diagnosis not present

## 2021-04-01 DIAGNOSIS — E669 Obesity, unspecified: Secondary | ICD-10-CM | POA: Diagnosis not present

## 2021-04-01 DIAGNOSIS — Z7722 Contact with and (suspected) exposure to environmental tobacco smoke (acute) (chronic): Secondary | ICD-10-CM | POA: Diagnosis not present

## 2021-04-01 DIAGNOSIS — L309 Dermatitis, unspecified: Secondary | ICD-10-CM | POA: Diagnosis not present

## 2021-04-01 DIAGNOSIS — Z809 Family history of malignant neoplasm, unspecified: Secondary | ICD-10-CM | POA: Diagnosis not present

## 2021-04-01 DIAGNOSIS — E785 Hyperlipidemia, unspecified: Secondary | ICD-10-CM | POA: Diagnosis not present

## 2021-04-01 DIAGNOSIS — K219 Gastro-esophageal reflux disease without esophagitis: Secondary | ICD-10-CM | POA: Diagnosis not present

## 2021-04-09 DIAGNOSIS — H5203 Hypermetropia, bilateral: Secondary | ICD-10-CM | POA: Diagnosis not present

## 2021-04-23 ENCOUNTER — Encounter (HOSPITAL_COMMUNITY): Payer: Self-pay | Admitting: Gastroenterology

## 2021-04-23 NOTE — Progress Notes (Signed)
Attempted to obtain medical history via telephone, unable to reach at this time. Unable to leave a voicemail to return pre surgical testing department's phone call.  ?

## 2021-05-01 ENCOUNTER — Ambulatory Visit (HOSPITAL_COMMUNITY): Admission: RE | Admit: 2021-05-01 | Payer: Medicare HMO | Source: Home / Self Care | Admitting: Gastroenterology

## 2021-05-01 ENCOUNTER — Encounter (HOSPITAL_COMMUNITY): Admission: RE | Payer: Self-pay | Source: Home / Self Care

## 2021-05-01 SURGERY — COLONOSCOPY WITH PROPOFOL
Anesthesia: Monitor Anesthesia Care

## 2022-05-12 ENCOUNTER — Other Ambulatory Visit: Payer: Self-pay | Admitting: Oral Surgery

## 2022-05-30 DIAGNOSIS — M810 Age-related osteoporosis without current pathological fracture: Secondary | ICD-10-CM | POA: Diagnosis not present

## 2022-05-30 DIAGNOSIS — M1990 Unspecified osteoarthritis, unspecified site: Secondary | ICD-10-CM | POA: Diagnosis not present

## 2022-05-30 DIAGNOSIS — G629 Polyneuropathy, unspecified: Secondary | ICD-10-CM | POA: Diagnosis not present

## 2022-05-30 DIAGNOSIS — K219 Gastro-esophageal reflux disease without esophagitis: Secondary | ICD-10-CM | POA: Diagnosis not present

## 2022-05-30 DIAGNOSIS — I10 Essential (primary) hypertension: Secondary | ICD-10-CM | POA: Diagnosis not present

## 2022-05-30 DIAGNOSIS — Z008 Encounter for other general examination: Secondary | ICD-10-CM | POA: Diagnosis not present

## 2022-05-30 DIAGNOSIS — E785 Hyperlipidemia, unspecified: Secondary | ICD-10-CM | POA: Diagnosis not present

## 2022-05-30 DIAGNOSIS — R32 Unspecified urinary incontinence: Secondary | ICD-10-CM | POA: Diagnosis not present

## 2022-07-21 DIAGNOSIS — E538 Deficiency of other specified B group vitamins: Secondary | ICD-10-CM | POA: Diagnosis not present

## 2022-07-21 DIAGNOSIS — E785 Hyperlipidemia, unspecified: Secondary | ICD-10-CM | POA: Diagnosis not present

## 2022-08-20 DIAGNOSIS — E538 Deficiency of other specified B group vitamins: Secondary | ICD-10-CM | POA: Diagnosis not present

## 2022-10-22 DIAGNOSIS — E611 Iron deficiency: Secondary | ICD-10-CM | POA: Diagnosis not present

## 2022-10-22 DIAGNOSIS — E785 Hyperlipidemia, unspecified: Secondary | ICD-10-CM | POA: Diagnosis not present

## 2022-10-22 DIAGNOSIS — E538 Deficiency of other specified B group vitamins: Secondary | ICD-10-CM | POA: Diagnosis not present

## 2022-10-22 DIAGNOSIS — R7301 Impaired fasting glucose: Secondary | ICD-10-CM | POA: Diagnosis not present

## 2022-10-28 DIAGNOSIS — R7301 Impaired fasting glucose: Secondary | ICD-10-CM | POA: Diagnosis not present

## 2022-10-28 DIAGNOSIS — E538 Deficiency of other specified B group vitamins: Secondary | ICD-10-CM | POA: Diagnosis not present

## 2022-10-28 DIAGNOSIS — N182 Chronic kidney disease, stage 2 (mild): Secondary | ICD-10-CM | POA: Diagnosis not present

## 2022-10-28 DIAGNOSIS — E785 Hyperlipidemia, unspecified: Secondary | ICD-10-CM | POA: Diagnosis not present

## 2022-10-29 DIAGNOSIS — E538 Deficiency of other specified B group vitamins: Secondary | ICD-10-CM | POA: Diagnosis not present

## 2022-12-03 DIAGNOSIS — E538 Deficiency of other specified B group vitamins: Secondary | ICD-10-CM | POA: Diagnosis not present

## 2023-01-26 DIAGNOSIS — E538 Deficiency of other specified B group vitamins: Secondary | ICD-10-CM | POA: Diagnosis not present

## 2023-02-24 DIAGNOSIS — E538 Deficiency of other specified B group vitamins: Secondary | ICD-10-CM | POA: Diagnosis not present

## 2023-04-27 DIAGNOSIS — E538 Deficiency of other specified B group vitamins: Secondary | ICD-10-CM | POA: Diagnosis not present

## 2023-04-29 DIAGNOSIS — R7301 Impaired fasting glucose: Secondary | ICD-10-CM | POA: Diagnosis not present

## 2023-04-29 DIAGNOSIS — N182 Chronic kidney disease, stage 2 (mild): Secondary | ICD-10-CM | POA: Diagnosis not present

## 2023-04-29 DIAGNOSIS — E785 Hyperlipidemia, unspecified: Secondary | ICD-10-CM | POA: Diagnosis not present

## 2023-04-29 DIAGNOSIS — E538 Deficiency of other specified B group vitamins: Secondary | ICD-10-CM | POA: Diagnosis not present

## 2023-05-02 DIAGNOSIS — E7800 Pure hypercholesterolemia, unspecified: Secondary | ICD-10-CM | POA: Diagnosis not present

## 2023-05-02 DIAGNOSIS — E538 Deficiency of other specified B group vitamins: Secondary | ICD-10-CM | POA: Diagnosis not present

## 2023-06-10 DIAGNOSIS — N182 Chronic kidney disease, stage 2 (mild): Secondary | ICD-10-CM | POA: Diagnosis not present

## 2023-06-10 DIAGNOSIS — E7800 Pure hypercholesterolemia, unspecified: Secondary | ICD-10-CM | POA: Diagnosis not present

## 2023-06-10 DIAGNOSIS — E785 Hyperlipidemia, unspecified: Secondary | ICD-10-CM | POA: Diagnosis not present

## 2023-06-12 DIAGNOSIS — E538 Deficiency of other specified B group vitamins: Secondary | ICD-10-CM | POA: Diagnosis not present

## 2023-07-23 DIAGNOSIS — E538 Deficiency of other specified B group vitamins: Secondary | ICD-10-CM | POA: Diagnosis not present

## 2023-08-12 ENCOUNTER — Observation Stay (HOSPITAL_BASED_OUTPATIENT_CLINIC_OR_DEPARTMENT_OTHER)
Admission: EM | Admit: 2023-08-12 | Discharge: 2023-08-13 | Disposition: A | Discharge status: Home / Self Care | Source: Skilled Nursing Facility | Attending: Internal Medicine | Admitting: Internal Medicine

## 2023-08-12 ENCOUNTER — Inpatient Hospital Stay (HOSPITAL_COMMUNITY)

## 2023-08-12 ENCOUNTER — Encounter (HOSPITAL_BASED_OUTPATIENT_CLINIC_OR_DEPARTMENT_OTHER): Payer: Self-pay | Admitting: Emergency Medicine

## 2023-08-12 ENCOUNTER — Emergency Department (HOSPITAL_BASED_OUTPATIENT_CLINIC_OR_DEPARTMENT_OTHER)

## 2023-08-12 ENCOUNTER — Other Ambulatory Visit: Payer: Self-pay

## 2023-08-12 DIAGNOSIS — R29818 Other symptoms and signs involving the nervous system: Secondary | ICD-10-CM | POA: Diagnosis not present

## 2023-08-12 DIAGNOSIS — I350 Nonrheumatic aortic (valve) stenosis: Secondary | ICD-10-CM | POA: Diagnosis not present

## 2023-08-12 DIAGNOSIS — I6782 Cerebral ischemia: Secondary | ICD-10-CM | POA: Diagnosis not present

## 2023-08-12 DIAGNOSIS — Z79899 Other long term (current) drug therapy: Secondary | ICD-10-CM | POA: Diagnosis not present

## 2023-08-12 DIAGNOSIS — E785 Hyperlipidemia, unspecified: Secondary | ICD-10-CM | POA: Diagnosis not present

## 2023-08-12 DIAGNOSIS — R42 Dizziness and giddiness: Secondary | ICD-10-CM | POA: Diagnosis not present

## 2023-08-12 DIAGNOSIS — I672 Cerebral atherosclerosis: Secondary | ICD-10-CM | POA: Diagnosis not present

## 2023-08-12 DIAGNOSIS — Z7982 Long term (current) use of aspirin: Secondary | ICD-10-CM | POA: Diagnosis not present

## 2023-08-12 DIAGNOSIS — H539 Unspecified visual disturbance: Secondary | ICD-10-CM | POA: Diagnosis not present

## 2023-08-12 DIAGNOSIS — R41841 Cognitive communication deficit: Secondary | ICD-10-CM | POA: Diagnosis present

## 2023-08-12 DIAGNOSIS — R2681 Unsteadiness on feet: Secondary | ICD-10-CM | POA: Diagnosis not present

## 2023-08-12 DIAGNOSIS — I639 Cerebral infarction, unspecified: Secondary | ICD-10-CM | POA: Diagnosis present

## 2023-08-12 DIAGNOSIS — I700 Atherosclerosis of aorta: Secondary | ICD-10-CM | POA: Diagnosis not present

## 2023-08-12 DIAGNOSIS — I6523 Occlusion and stenosis of bilateral carotid arteries: Secondary | ICD-10-CM | POA: Diagnosis not present

## 2023-08-12 DIAGNOSIS — G459 Transient cerebral ischemic attack, unspecified: Secondary | ICD-10-CM | POA: Diagnosis not present

## 2023-08-12 LAB — DIFFERENTIAL
Abs Immature Granulocytes: 0.02 K/uL (ref 0.00–0.07)
Basophils Absolute: 0 K/uL (ref 0.0–0.1)
Basophils Relative: 0 %
Eosinophils Absolute: 0.1 K/uL (ref 0.0–0.5)
Eosinophils Relative: 1 %
Immature Granulocytes: 0 %
Lymphocytes Relative: 23 %
Lymphs Abs: 1.6 K/uL (ref 0.7–4.0)
Monocytes Absolute: 0.4 K/uL (ref 0.1–1.0)
Monocytes Relative: 6 %
Neutro Abs: 4.8 K/uL (ref 1.7–7.7)
Neutrophils Relative %: 70 %

## 2023-08-12 LAB — COMPREHENSIVE METABOLIC PANEL WITH GFR
ALT: 10 U/L (ref 0–44)
AST: 22 U/L (ref 15–41)
Albumin: 4.1 g/dL (ref 3.5–5.0)
Alkaline Phosphatase: 72 U/L (ref 38–126)
Anion gap: 10 (ref 5–15)
BUN: 13 mg/dL (ref 8–23)
CO2: 26 mmol/L (ref 22–32)
Calcium: 9.4 mg/dL (ref 8.9–10.3)
Chloride: 104 mmol/L (ref 98–111)
Creatinine, Ser: 0.88 mg/dL (ref 0.44–1.00)
GFR, Estimated: >60 mL/min (ref >60)
Glucose, Bld: 99 mg/dL (ref 70–99)
Potassium: 4.2 mmol/L (ref 3.5–5.1)
Sodium: 140 mmol/L (ref 135–145)
Total Bilirubin: 0.5 mg/dL (ref 0.0–1.2)
Total Protein: 7.1 g/dL (ref 6.5–8.1)

## 2023-08-12 LAB — CBC
HCT: 36.7 % (ref 36.0–46.0)
Hemoglobin: 12 g/dL (ref 12.0–15.0)
MCH: 28.8 pg (ref 26.0–34.0)
MCHC: 32.7 g/dL (ref 30.0–36.0)
MCV: 88 fL (ref 80.0–100.0)
Platelets: 230 K/uL (ref 150–400)
RBC: 4.17 MIL/uL (ref 3.87–5.11)
RDW: 13.6 % (ref 11.5–15.5)
WBC: 6.9 K/uL (ref 4.0–10.5)
nRBC: 0 % (ref 0.0–0.2)

## 2023-08-12 LAB — URINE DRUG SCREEN
Amphetamines: NOT DETECTED
Barbiturates: NOT DETECTED
Benzodiazepines: NOT DETECTED
Cocaine: NOT DETECTED
Fentanyl: NOT DETECTED
Methadone Scn, Ur: NOT DETECTED
Opiates: NOT DETECTED
Tetrahydrocannabinol: NOT DETECTED

## 2023-08-12 LAB — PROTIME-INR
INR: 1 (ref 0.8–1.2)
Prothrombin Time: 13.8 s (ref 11.4–15.2)

## 2023-08-12 LAB — ETHANOL: Alcohol, Ethyl (B): <15 mg/dL (ref <15)

## 2023-08-12 LAB — APTT: aPTT: 26 s (ref 24–36)

## 2023-08-12 MED ORDER — ACETAMINOPHEN 325 MG PO TABS: 650.0000 mg | ORAL_TABLET | ORAL | Status: DC | PRN

## 2023-08-12 MED ORDER — SENNOSIDES-DOCUSATE SODIUM 8.6-50 MG PO TABS: 1.0000 | ORAL_TABLET | Freq: Every evening | ORAL | Status: DC | PRN

## 2023-08-12 MED ORDER — ACETAMINOPHEN 160 MG/5ML PO SOLN: 650.0000 mg | ORAL | Status: DC | PRN

## 2023-08-12 MED ORDER — STROKE: EARLY STAGES OF RECOVERY BOOK
Freq: Once | Status: AC
  Filled 2023-08-12: qty 1

## 2023-08-12 MED ORDER — CLOPIDOGREL BISULFATE 75 MG PO TABS
75.0000 mg | ORAL_TABLET | Freq: Every day | ORAL | Status: DC
  Administered 2023-08-13: 75 mg via ORAL
  Filled 2023-08-12: qty 1

## 2023-08-12 MED ORDER — ASPIRIN 81 MG PO TBEC
81.0000 mg | DELAYED_RELEASE_TABLET | Freq: Every day | ORAL | Status: DC
  Administered 2023-08-13: 81 mg via ORAL
  Filled 2023-08-12: qty 1

## 2023-08-12 MED ORDER — IOHEXOL 350 MG/ML SOLN
75.0000 mL | Freq: Once | INTRAVENOUS | Status: AC | PRN
  Administered 2023-08-12: 75 mL via INTRAVENOUS

## 2023-08-12 MED ORDER — ACETAMINOPHEN 650 MG RE SUPP: 650.0000 mg | RECTAL | Status: DC | PRN

## 2023-08-12 MED ORDER — SODIUM CHLORIDE 0.9 % IV SOLN: INTRAVENOUS | Status: DC

## 2023-08-12 MED ORDER — ASPIRIN 325 MG PO TABS
325.0000 mg | ORAL_TABLET | Freq: Once | ORAL | Status: AC
  Administered 2023-08-12: 325 mg via ORAL
  Filled 2023-08-12: qty 1

## 2023-08-12 MED ORDER — CLOPIDOGREL BISULFATE 300 MG PO TABS
300.0000 mg | ORAL_TABLET | Freq: Once | ORAL | Status: AC
  Administered 2023-08-12: 300 mg via ORAL
  Filled 2023-08-12: qty 1

## 2023-08-12 NOTE — H&P (Signed)
 Julia Bowman FMW:[id number] DOB: August 05, 1945 DOA: 08/12/2023     PCP: Signa Rush, MD (Inactive)     Patient arrived to ER on 08/12/23 at 1529 Referred by Attending Odell Celinda Balo, MD   Patient coming from:    home Lives alone,     From facility  [facility]    Chief Complaint:  Chief Complaint  Patient presents with   Dizziness    HPI: Julia Bowman is a 78 y.o. female with medical history significant of aortic stenosis HLD    Presented with ataxia and disequilibrium  Presents with dizziness and unsteady gait trouble focusing her eyes started around 11 AM today not associated nausea vomiting Code stroke activated Patient with known history of aortic stenosis hyperlipidemia no slurred speech or facial droop Symptoms lasted for 2 hours and then completely resolved.  No focal weakness or numbness.    At baseline walks independently   Denies significant ETOH intake   Does not smoke       Regarding pertinent Chronic problems:    Hyperlipidemia - not on statins not taking   While in ER:     CT head nonacute CTA showing mild atherosclerosis 2 mm protrusion from the left supraclinoid ICA, favor infundibulum over aneurysm.  Lab Orders         Ethanol         Protime-INR         APTT         CBC         Differential         Comprehensive metabolic panel         Rapid urine drug screen (hospital performed)      CT HEAD   NON acute   MRI brain  ordered  CXR -  ordered    Following Medications were ordered in ER: Medications  clopidogrel  (PLAVIX ) tablet 75 mg (has no administration in time range)  clopidogrel  (PLAVIX ) tablet 300 mg (300 mg Oral Given 08/12/23 1626)  aspirin  tablet 325 mg (325 mg Oral Given 08/12/23 1626)  iohexol  (OMNIPAQUE ) 350 MG/ML injection 75 mL (75 mLs Intravenous Contrast Given 08/12/23 1707)    _______________________________________________________ ER Provider Called:     Neurology   Dr.  Michaela  They Recommend admit to medicine    Neurology will see on arrival to [facility]    ED Triage Vitals  Encounter Vitals Group     BP 08/12/23 1538 (!) 153/71     Girls Systolic BP Percentile --      Girls Diastolic BP Percentile --      Boys Systolic BP Percentile --      Boys Diastolic BP Percentile --      Pulse Rate 08/12/23 1538 82     Resp 08/12/23 1538 12     Temp 08/12/23 1538 97.6 F (36.4 C)     Temp Source 08/12/23 1945 Oral     SpO2 08/12/23 1538 95 %     Weight 08/12/23 1538 181 lb (82.1 kg)     Height 08/12/23 1538 5' 3 (1.6 m)     Head Circumference --      Peak Flow --      Pain Score 08/12/23 1538 0     Pain Loc --      Pain Education --      Exclude from Growth Chart --   UFJK(75)@     _________________________________________ Significant initial  Findings: Abnormal  Labs Reviewed - No abnormal labs to display    ____    ECG: Ordered Personally reviewed and interpreted by me showing: HR : 73 Rhythm:Sinus rhythm No old tracing to compare QTC 439    The recent clinical data is shown below. Vitals:   08/12/23 1700 08/12/23 1927 08/12/23 1945 08/12/23 2040  BP: (!) 127/104 (!) 139/92 (!) 140/96 (!) 174/95  Pulse: 81 69 72 89  Resp: 15 19 17 16   Temp:   97.7 F (36.5 C) 98 F (36.7 C)  TempSrc:   Oral Oral  SpO2: 95% 96% 100% 99%  Weight:      Height:        WBC     Component Value Date/Time   WBC 6.9 08/12/2023 1552   LYMPHSABS 1.6 08/12/2023 1552   MONOABS 0.4 08/12/2023 1552   EOSABS 0.1 08/12/2023 1552   BASOSABS 0.0 08/12/2023 1552          UA  ordered    __________________________________________________________ Recent Labs  Lab 08/12/23 1552  NA 140  K 4.2  CO2 26  GLUCOSE 99  BUN 13  CREATININE 0.88  CALCIUM  9.4    Cr    stable,  Lab Results  Component Value Date   CREATININE 0.88 08/12/2023    Recent Labs  Lab 08/12/23 1552  AST 22  ALT 10  ALKPHOS 72  BILITOT 0.5  PROT 7.1  ALBUMIN 4.1   Lab  Results  Component Value Date   CALCIUM  9.4 08/12/2023    Plt: Lab Results  Component Value Date   PLT 230 08/12/2023       Recent Labs  Lab 08/12/23 1552  WBC 6.9  NEUTROABS 4.8  HGB 12.0  HCT 36.7  MCV 88.0  PLT 230    HG/HCT  stable     Component Value Date/Time   HGB 12.0 08/12/2023 1552   HCT 36.7 08/12/2023 1552   MCV 88.0 08/12/2023 1552      No results for input(s): LIPASE, AMYLASE in the last 168 hours. No results for input(s): AMMONIA in the last 168 hours.    _______________________________________________ Hospitalist was called for admission for   TIA      The following Work up has been ordered so far:  Orders Placed This Encounter  Procedures   CT HEAD CODE STROKE WO CONTRAST   CT ANGIO HEAD NECK W WO CM   Ethanol   Protime-INR   APTT   CBC   Differential   Comprehensive metabolic panel   Rapid urine drug screen (hospital performed)   Diet NPO time specified   Vital signs   ED Cardiac monitoring   NIH Stroke Scale   Swallow screen   Activate teleneurology   Initiate Carrier Fluid Protocol   If O2 sat <94% Administer O2 @ 2 Liters/Minute If O2 Sat < 94%, administer O2 at 2 liters/minute via nasal cannula.   Cardiac Monitoring - Continuous Indefinite   Consult to hospitalist   ED Pulse oximetry, continuous   ED EKG   EKG 12-Lead   EKG   EKG   Saline lock IV   Admit to Inpatient (patient's expected length of stay will be greater than 2 midnights or inpatient only procedure)     OTHER Significant initial  Findings:  labs showing:     DM  labs:  HbA1C: No results for input(s): HGBA1C in the last 8760 hours.     CBG (last 3)  No results for input(s): GLUCAP  in the last 72 hours.        Cultures: No results found for: SDES, SPECREQUEST, CULT, REPTSTATUS   Radiological Exams on Admission: CT ANGIO HEAD NECK W WO CM Result Date: 08/12/2023 CLINICAL DATA:  Stroke, follow up.  Dizziness and unsteady gait.  EXAM: CT ANGIOGRAPHY HEAD AND NECK WITH AND WITHOUT CONTRAST TECHNIQUE: Multidetector CT imaging of the head and neck was performed using the standard protocol during bolus administration of intravenous contrast. Multiplanar CT image reconstructions and MIPs were obtained to evaluate the vascular anatomy. Carotid stenosis measurements (when applicable) are obtained utilizing NASCET criteria, using the distal internal carotid diameter as the denominator. RADIATION DOSE REDUCTION: This exam was performed according to the departmental dose-optimization program which includes automated exposure control, adjustment of the mA and/or kV according to patient size and/or use of iterative reconstruction technique. CONTRAST:  75mL OMNIPAQUE  IOHEXOL  350 MG/ML SOLN COMPARISON:  None Available. FINDINGS: CTA NECK FINDINGS Aortic arch: Standard branching. Mild atherosclerosis. Patent brachiocephalic and subclavian arteries with calcified and soft plaque in the proximal left subclavian artery not resulting in a significant stenosis. Right carotid system: Patent with a small amount of calcified plaque at the carotid bifurcation. No evidence of a significant stenosis or dissection. Left carotid system: Patent with a small amount of calcified plaque at the carotid bifurcation. No evidence of a significant stenosis or dissection. Vertebral arteries: Patent without evidence stenosis or dissection. Codominant. Skeleton: Moderate lower cervical disc degeneration. Widespread advanced facet arthrosis in the cervical and upper thoracic spine. Severe multilevel neural foraminal stenosis. No evidence of high-grade spinal canal stenosis. Other neck: No evidence cervical lymphadenopathy or mass. Upper chest: Clear lung apices. Review of the MIP images confirms the above findings CTA HEAD FINDINGS Anterior circulation: The internal carotid arteries are patent from skull base to carotid termini with mild atherosclerosis bilaterally not resulting  in a significant stenosis. There is a 2 mm protrusion from the left supraclinoid ICA in the posterior communicating region, likely with a tiny vessel arising from its apex on coronal images. ACAs and MCAs are patent without evidence of a proximal branch occlusion or significant proximal stenosis. Posterior circulation: The intracranial vertebral arteries are patent with mild atherosclerosis not resulting in a significant stenosis. Patent right PICA, bilateral AICA, and bilateral SCA origins are visualized. The basilar artery is widely patent. There are large right and diminutive or absent left posterior communicating arteries with hypoplasia of the right P1 segment. Both PCAs are patent without evidence of a significant proximal stenosis. No aneurysm is identified. Venous sinuses: As permitted by contrast timing, patent. Anatomic variants: Fetal right PCA. Review of the MIP images confirms the above findings IMPRESSION: 1. Mild atherosclerosis in the head and neck without a large vessel occlusion or significant proximal stenosis. 2. 2 mm protrusion from the left supraclinoid ICA, favor infundibulum over aneurysm. 3.  Aortic Atherosclerosis (ICD10-I70.0). Electronically Signed   By: Dasie Hamburg M.D.   On: 08/12/2023 17:51   CT HEAD CODE STROKE WO CONTRAST Result Date: 08/12/2023 CLINICAL DATA:  Code stroke. Neuro deficit, acute, stroke suspected. Dizziness and unsteady gait. EXAM: CT HEAD WITHOUT CONTRAST TECHNIQUE: Contiguous axial images were obtained from the base of the skull through the vertex without intravenous contrast. RADIATION DOSE REDUCTION: This exam was performed according to the departmental dose-optimization program which includes automated exposure control, adjustment of the mA and/or kV according to patient size and/or use of iterative reconstruction technique. COMPARISON:  None Available. FINDINGS: Brain: No acute cortically based  infarct, intracranial hemorrhage, mass, midline shift, or  extra-axial fluid collection is identified. Patchy to confluent hypodensities in the cerebral white matter are nonspecific but compatible with moderate to severe chronic small vessel ischemic disease. 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 Mild cerebral atrophy is within normal limits for age. Vascular: Calcified atherosclerosis at the skull base. No hyperdense vessel. Skull: No fracture or suspicious lesion. Sinuses/Orbits: Clear paranasal sinuses. Moderate left mastoid effusion. Bilateral cataract extraction. Other: None. ASPECTS (Alberta Stroke Program Early CT Score) - Ganglionic level infarction (caudate, lentiform nuclei, internal capsule, insula, M1-M3 cortex): 7 - Supraganglionic infarction (M4-M6 cortex): 3 Total score (0-10 with 10 being normal): 10 These results were communicated to Dr. Michaela at 4:06 pm on 08/12/2023 by text page via the [facility] messaging system. IMPRESSION: 1. No evidence of acute intracranial abnormality. ASPECTS of 10. 2. Moderate to severe chronic small vessel ischemic disease. Electronically Signed   By: Dasie Hamburg M.D.   On: 08/12/2023 16:06   _______________________________________________________________________________________________________ Latest  Blood pressure (!) 174/95, pulse 89, temperature 98 F (36.7 C), temperature source Oral, resp. rate 16, height 5' 3 (1.6 m), weight 82.1 kg, SpO2 99%.   Vitals  labs and radiology finding personally reviewed  Review of Systems:    Pertinent positives include:  ataxia gait abnormality,   Constitutional:  No weight loss, night sweats, Fevers, chills, fatigue, weight loss  HEENT:  No headaches, Difficulty swallowing,Tooth/dental problems,Sore throat,  No sneezing, itching, ear ache, nasal congestion, post nasal drip,  Cardio-vascular:  No chest pain, Orthopnea, PND, anasarca, dizziness, palpitations.no  Bilateral lower extremity swelling  GI:  No heartburn, indigestion, abdominal pain, nausea, vomiting, diarrhea, change in bowel habits, loss of appetite, melena, blood in stool, hematemesis Resp:  no shortness of breath at rest. No dyspnea on exertion, No excess mucus, no productive cough, No non-productive cough, No coughing up of blood.No change in color of mucus.No wheezing. Skin:  no rash or lesions. No jaundice GU:  no dysuria, change in color of urine, no urgency or frequency. No straining to urinate.  No flank pain.  Musculoskeletal:  No joint pain or no joint swelling. No decreased range of motion. No back pain.  Psych:  No change in mood or affect. No depression or anxiety. No memory loss.  Neuro: no localizing neurological complaints, no tingling, no weakness, no double vision, no no slurred speech, no confusion  All systems reviewed and apart from HOPI all are negative _______________________________________________________________________________________________ Past Medical History:   Past Medical History:  Diagnosis Date   Dysrhythmia       Past Surgical History:  Procedure Laterality Date   ABDOMINAL HYSTERECTOMY     BREAST BIOPSY     COLONOSCOPY WITH PROPOFOL  N/A 01/17/2015   Procedure: COLONOSCOPY WITH PROPOFOL ;  Surgeon: Elsie Cree, MD;  Location: [facility];  Service: Endoscopy;  Laterality: N/A;   HAND SURGERY     STOMACH SURGERY     TONSILLECTOMY      Social History:  Ambulatory   independently       reports that she has never smoked. She does not have any smokeless tobacco history on file. She reports that she does not drink alcohol and does not use drugs.    Family History:   History reviewed. No pertinent family history. ______________________________________________________________________________________________ Allergies: No Known Allergies   Prior to Admission medications   Medication Sig Start Date End  Date Taking? Authorizing  Provider  ciprofloxacin -dexamethasone  (CIPRODEX ) OTIC suspension Place 4 drops into both ears 2 (two) times daily. 4 drops in each ear twice a day for the next 2 days or until drainage from the ears stops. 11/13/20   Ethyl Lonni BRAVO, MD  cyanocobalamin  (,VITAMIN B-12,) 1000 MCG/ML injection Inject 1,000 mcg into the skin every 30 (thirty) days.  10/04/14   [provider]  fluticasone  (FLONASE ) 50 MCG/ACT nasal spray Place 2 sprays into both nostrils daily. 2 sprays each nostril at night 11/07/20   Ethyl Lonni BRAVO, MD  GAVILYTE-N WITH FLAVOR PACK 420 G solution Take 4,000 mLs by mouth once.  01/09/15   [provider]  hydroxypropyl methylcellulose / hypromellose (ISOPTO TEARS / GONIOVISC) 2.5 % ophthalmic solution Place 1 drop into both eyes 4 (four) times daily as needed for dry eyes.    [provider]  Multiple Vitamin (MULTIVITAMIN WITH MINERALS) TABS tablet Take 1 tablet by mouth daily.    [provider]  triamcinolone  (NASACORT ) 55 MCG/ACT AERO nasal inhaler Place 2 sprays into the nose daily. 2 sprays each nostril at night 11/13/20   Ethyl Lonni BRAVO, MD    ___________________________________________________________________________________________________ Physical Exam:    08/12/2023    8:40 PM 08/12/2023    7:45 PM 08/12/2023    7:27 PM  Vitals with BMI  Systolic 174 140 860  Diastolic 95 96 92  Pulse 89 72 69     1. General:  in No  Acute distress    Chronically ill   -appearing 2. Psychological: Alert and   Oriented 3. Head/ENT:   Dry Mucous Membranes                          Head Non traumatic, neck supple                       Poor Dentition 4. SKIN:  decreased Skin turgor,  Skin clean Dry and intact no rash    5. Heart: Regular rate and rhythm systolic Murmur, no Rub or gallop 6. Lungs:  , no wheezes or crackles   7. Abdomen: Soft,  non-tender, Non distended  bowel sounds present 8. Lower extremities: no clubbing,  cyanosis, no  edema 9. Neurologically  strength 5 out of 5 in all 4 extremities cranial nerves II through XII intact 10. MSK: Normal range of motion    Chart has been reviewed  ______________________________________________________________________________________________  Assessment/Plan  78 y.o. female with medical history significant of aortic stenosis HLD   Admitted for   TIA  vs CVA    Present on Admission:  TIA (transient ischemic attack) Hyperlipidemia    TIA (transient ischemic attack)  - will admit based on TIA/CVA protocol,         Monitor on Tele       MRA/MRI await  results  showing acute         CTA  no LVO 2 mm protrusion from the left supraclinoid ICA, favor infundibulum over aneurysm.       Echo to evaluate for possible embolic source,        obtain cardiac enzymes,  ECG,   Lipid panel, TSH.        Order PT/OT evaluation.        Will make sure patient is on antiplatelet ASA 81  ,   Plavix  agent and statin if LDL >70  Allow permissive Hypertension keep BP <220/120        Neurology consulted  will see in AM  Hyperlipidemia patient not on statin if LDL is above 70 would restart  Other plan as per orders.  DVT prophylaxis:  SCD      Code Status:    Code Status: Not on file FULL CODE  as per patient   I had personally discussed CODE STATUS with patient a   ACP   none     Family Communication:   Family not at  Bedside    Diet  Diet Orders (From admission, onward)     Start     Ordered   08/12/23 1552  Diet NPO time specified  Diet effective now       Comments: NPO until stroke swallow screen is complete   08/12/23 1551            Disposition Plan:          To home once workup is complete and patient is stable   Following barriers for discharge:                     Stroke  work up is complete                                                        Will need consultants to evaluate patient prior to discharge                                Consult Orders  (From admission, onward)           Start     Ordered   08/12/23 2139  PT eval and treat  Routine        08/12/23 2140   08/12/23 2139  OT eval and treat  Routine        08/12/23 2140   08/12/23 2139  SLP eval and treat Reason for evaluation: Cognitive/Language evaluation  Once       Question:  Reason for evaluation  Answer:  Cognitive/Language evaluation   08/12/23 2140   08/12/23 1655  Consult to hospitalist  Called care Link for consult talked to [facility] at 4:58  Once       Provider:  (Not yet assigned)  Question Answer Comment  Place call to: [facility]   Reason for Consult Admit      08/12/23 1654                               Would benefit from PT/OT eval prior to DC  Ordered                      Consults called: neurology    Admission status:  ED Disposition     ED Disposition  Admit   Condition  --   Comment  Hospital Area: [facility] [[id number]]  Level of Care: Telemetry Medical [[id number]]  May admit patient to [facility] or [facility] if equivalent level of care is available:: No  Interfacility transfer: Yes  Covid Evaluation: Asymptomatic - no recent exposure (last 10 days) testing not required  Diagnosis: Acute  CVA (cerebrovascular accident) [facility]) [[id number]]  Admitting Physician: FELIZ ORTIZ, ABRAHAM [[id number]]  Attending Physician: FELIZ ORTIZ, ABRAHAM [[id number]]  Certification:: I certify this patient will need inpatient services for at least 2 midnights  Expected Medical Readiness: 08/14/2023           inpatient     I Expect 2 midnight stay secondary to severity of patient's current illness need for inpatient interventions justified by the following:     Severe lab/radiological/exam abnormalities including:    TIA (transient ischemic attack) vs CVA    and extensive comorbidities including:  That are currently affecting medical management.   I expect  patient to be hospitalized for 2 midnights requiring  inpatient medical care.  Patient is at high risk for adverse outcome (such as loss of life or disability) if not treated.    Need for   IV fluids,,     Level of care     tele indefinitely please discontinue once patient no longer qualifies COVID-19 Labs   Gareld Obrecht 08/12/2023, 11:50 PM    [facility]     after 2 AM please page floor coverage   If 7AM-7PM, please contact the day team taking care of the patient using [email or web address]

## 2023-08-12 NOTE — ED Notes (Signed)
 EDP at bedside doing assessment, Code stroke activated per EDP. CT has been called. Waiting for EDP to finish assessment before taking pt to CT

## 2023-08-12 NOTE — ED Notes (Signed)
 EDP Belfi at bedside to evaluate pt

## 2023-08-12 NOTE — Assessment & Plan Note (Signed)
-   will admit based on TIA/CVA protocol,         Monitor on Tele       MRA/MRI await  results  showing acute         CTA  no LVO 2 mm protrusion from the left supraclinoid ICA, favor infundibulum over aneurysm.       Echo to evaluate for possible embolic source,        obtain cardiac enzymes,  ECG,   Lipid panel, TSH.        Order PT/OT evaluation.        Will make sure patient is on antiplatelet ASA 81  ,   Plavix  agent and statin if LDL >70       Allow permissive Hypertension keep BP <220/120        Neurology consulted  will see in AM

## 2023-08-12 NOTE — Consult Note (Signed)
 [facility] Telemedicine Consult   Requesting Provider: Lenor HERO Consult Participants: Bedside nurse, friends Location of the provider: [facility] Location of the patient: [facility]  This consult was provided via telemedicine with 2-way video and audio communication. The patient/family was informed that care would be provided in this way and agreed to receive care in this manner.    Chief Complaint: Dizziness  HPI: She noticed that she stumbled sometime between 11 and 12 she began stumbling.  She also felt like she was having mild confusion and had trouble focusing.  She states that she felt like her vision was just unclear and she could not focus her eyes at all during this timeframe.  Her friends state that she stumbled multiple times, and the patient was apparently not aware of it at least on one occasion.  She also was acting slightly confused during this time.  The symptoms lasted for about 2 hours before completely resolving.  She complained to her friends of dizziness, and on questioning it sounds like it was a disequilibrium, she did not feel lightheaded and did not feel a vertiginous sensation.  She denies any focal weakness or numbness or other symptoms.   LKW: 11 am tnk given?: No, resolved symptoms IR Thrombectomy? No, outside of window Time of teleneurologist evaluation: 3:55 pm  Exam: Vitals:   08/12/23 1538  BP: (!) 153/71  Pulse: 82  Resp: 12  Temp: 97.6 F (36.4 C)  SpO2: 95%    General: In bed, NAD  1A: Level of Consciousness - 0 1B: Ask Month and Age - 0 1C: 'Blink Eyes' & 'Squeeze Hands' - 0 2: Test Horizontal Extraocular Movements - 0 3: Test Visual Fields - 0 4: Test Facial Palsy - 0 5A: Test Left Arm Motor Drift - 0 5B: Test Right Arm Motor Drift - 0 6A: Test Left Leg Motor Drift - 0 6B: Test Right Leg Motor Drift - 0 7: Test Limb Ataxia - 0 8: Test Sensation - 0 9: Test Language/Aphasia- 0 10: Test Dysarthria - 0 11: Test  Extinction/Inattention - 0 NIHSS score: 0  Labs reviewed: CBC is normal  Imaging Reviewed: CT-negative   Assessment: 78 year old female with transient disequilibrium, blurred vision and mild confusion with resolution of symptoms.  I am concerned that this could have been a TIA and I would favor treating it as such.  I would favor overnight observation for secondary risk factor modification.  Recommendations:  - HgbA1c, fasting lipid panel - MRI of the brain without contrast - Frequent neuro checks - Echocardiogram - CTA head and neck - Prophylactic therapy-Antiplatelet med: Aspirin  - dose 81mg  and plavix  75mg  daily  after 300mg  load  - Risk factor modification - Telemetry monitoring - PT consult, OT consult, Speech consult - Stroke team to follow   Aisha Seals, MD [facility] [phone]  If 7pm- 7am, please page neurology on call as listed in AMION.

## 2023-08-12 NOTE — ED Triage Notes (Signed)
 Pt c/o dizziness, unsteady gait, eyes not focusing since between 1100 and 1200 today; denies NV

## 2023-08-12 NOTE — Subjective & Objective (Signed)
 Presents with dizziness and unsteady gait trouble focusing her eyes started around 11 AM today not associated nausea vomiting Code stroke activated Patient with known history of aortic stenosis hyperlipidemia no slurred speech or facial droop Symptoms lasted for 2 hours and then completely resolved.  No focal weakness or numbness.

## 2023-08-12 NOTE — ED Provider Notes (Signed)
 [facility] Provider Note   CSN: [id number] Arrival date & time: 08/12/23  1529     Patient presents with: Dizziness   Julia Bowman is a 78 y.o. female.   Patient is a 78 year old female with a history of aortic stenosis and hyperlipidemia who presents with unsteady gait.  She states that it started somewhere between 11 and 1230.  She went into a Bible study class at 11 this morning and was asymptomatic at that point.  She said when she stood up around 1215 at 1230 she noticed that she was off balance.  She does not really describe any dizziness.  She just feels like she is off balance.  She denies any numbness or weakness in her legs.  She had told her friends that she was not able to focus her eyes but she denies any double vision or blind spots.  She did not notice any slurred speech or facial drooping.  Her friend stated that she looked very pale when this happened but her color is back now.  She says she has had some recent memory problems but no other symptoms such as this.       Prior to Admission medications   Medication Sig Start Date End Date Taking? Authorizing Provider  ciprofloxacin -dexamethasone  (CIPRODEX ) OTIC suspension Place 4 drops into both ears 2 (two) times daily. 4 drops in each ear twice a day for the next 2 days or until drainage from the ears stops. 11/13/20   Ethyl Lonni BRAVO, MD  cyanocobalamin  (,VITAMIN B-12,) 1000 MCG/ML injection Inject 1,000 mcg into the skin every 30 (thirty) days.  10/04/14   [provider]  fluticasone  (FLONASE ) 50 MCG/ACT nasal spray Place 2 sprays into both nostrils daily. 2 sprays each nostril at night 11/07/20   Ethyl Lonni BRAVO, MD  GAVILYTE-N WITH FLAVOR PACK 420 G solution Take 4,000 mLs by mouth once.  01/09/15   [provider]  hydroxypropyl methylcellulose / hypromellose (ISOPTO TEARS / GONIOVISC) 2.5 % ophthalmic solution Place 1 drop into both eyes 4  (four) times daily as needed for dry eyes.    [provider]  Multiple Vitamin (MULTIVITAMIN WITH MINERALS) TABS tablet Take 1 tablet by mouth daily.    [provider]  triamcinolone  (NASACORT ) 55 MCG/ACT AERO nasal inhaler Place 2 sprays into the nose daily. 2 sprays each nostril at night 11/13/20   Ethyl Lonni BRAVO, MD    Allergies: Patient has no known allergies.    Review of Systems  Constitutional:  Positive for fatigue. Negative for chills, diaphoresis and fever.  HENT:  Negative for congestion, rhinorrhea and sneezing.   Eyes:  Positive for visual disturbance.  Respiratory:  Negative for cough, chest tightness and shortness of breath.   Cardiovascular:  Negative for chest pain and leg swelling.  Gastrointestinal:  Negative for abdominal pain, blood in stool, diarrhea, nausea and vomiting.  Genitourinary:  Negative for difficulty urinating, flank pain, frequency and hematuria.  Musculoskeletal:  Negative for arthralgias and back pain.  Skin:  Negative for rash.  Neurological:  Negative for dizziness, speech difficulty, weakness, numbness and headaches.       Gait disturbance    Updated Vital Signs BP (!) 127/104   Pulse 81   Temp 97.6 F (36.4 C)   Resp 15   Ht 5' 3 (1.6 m)   Wt 82.1 kg   SpO2 95%   BMI 32.06 kg/m   Physical Exam Constitutional:  Appearance: She is well-developed.  HENT:     Head: Normocephalic and atraumatic.  Eyes:     Pupils: Pupils are equal, round, and reactive to light.     Comments: Positive subtle horizontal nystagmus both to the left and the right gaze.  There is also some vertical nystagmus present in both eyes  Cardiovascular:     Rate and Rhythm: Normal rate and regular rhythm.     Heart sounds: Murmur heard.  Pulmonary:     Effort: Pulmonary effort is normal. No respiratory distress.     Breath sounds: Normal breath sounds. No wheezing or rales.  Chest:     Chest wall: No tenderness.  Abdominal:      General: Bowel sounds are normal.     Palpations: Abdomen is soft.     Tenderness: There is no abdominal tenderness. There is no guarding or rebound.  Musculoskeletal:        General: Normal range of motion.     Cervical back: Normal range of motion and neck supple.  Lymphadenopathy:     Cervical: No cervical adenopathy.  Skin:    General: Skin is warm and dry.     Findings: No rash.  Neurological:     Mental Status: She is alert and oriented to person, place, and time.     Comments: Motor 5/5 all extremities Sensation grossly intact to LT all extremities Finger to Nose intact, no pronator drift CN II-XII grossly intact       (all labs ordered are listed, but only abnormal results are displayed) Labs Reviewed  ETHANOL  PROTIME-INR  APTT  CBC  DIFFERENTIAL  COMPREHENSIVE METABOLIC PANEL WITH GFR  URINE DRUG SCREEN    EKG: EKG Interpretation Date/Time:  Wednesday August 12 2023 16:24:17 EDT Ventricular Rate:  73 PR Interval:  163 QRS Duration:  88 QT Interval:  398 QTC Calculation: 439 R Axis:   41  Text Interpretation: Sinus rhythm No old tracing to compare Confirmed by Lenor Hollering [phone]) on 08/12/2023 4:25:35 PM  Radiology: CT ANGIO HEAD NECK W WO CM Result Date: 08/12/2023 CLINICAL DATA:  Stroke, follow up.  Dizziness and unsteady gait. EXAM: CT ANGIOGRAPHY HEAD AND NECK WITH AND WITHOUT CONTRAST TECHNIQUE: Multidetector CT imaging of the head and neck was performed using the standard protocol during bolus administration of intravenous contrast. Multiplanar CT image reconstructions and MIPs were obtained to evaluate the vascular anatomy. Carotid stenosis measurements (when applicable) are obtained utilizing NASCET criteria, using the distal internal carotid diameter as the denominator. RADIATION DOSE REDUCTION: This exam was performed according to the departmental dose-optimization program which includes automated exposure control, adjustment of the mA and/or kV  according to patient size and/or use of iterative reconstruction technique. CONTRAST:  75mL OMNIPAQUE  IOHEXOL  350 MG/ML SOLN COMPARISON:  None Available. FINDINGS: CTA NECK FINDINGS Aortic arch: Standard branching. Mild atherosclerosis. Patent brachiocephalic and subclavian arteries with calcified and soft plaque in the proximal left subclavian artery not resulting in a significant stenosis. Right carotid system: Patent with a small amount of calcified plaque at the carotid bifurcation. No evidence of a significant stenosis or dissection. Left carotid system: Patent with a small amount of calcified plaque at the carotid bifurcation. No evidence of a significant stenosis or dissection. Vertebral arteries: Patent without evidence stenosis or dissection. Codominant. Skeleton: Moderate lower cervical disc degeneration. Widespread advanced facet arthrosis in the cervical and upper thoracic spine. Severe multilevel neural foraminal stenosis. No evidence of high-grade spinal canal stenosis. Other neck: No  evidence cervical lymphadenopathy or mass. Upper chest: Clear lung apices. Review of the MIP images confirms the above findings CTA HEAD FINDINGS Anterior circulation: The internal carotid arteries are patent from skull base to carotid termini with mild atherosclerosis bilaterally not resulting in a significant stenosis. There is a 2 mm protrusion from the left supraclinoid ICA in the posterior communicating region, likely with a tiny vessel arising from its apex on coronal images. ACAs and MCAs are patent without evidence of a proximal branch occlusion or significant proximal stenosis. Posterior circulation: The intracranial vertebral arteries are patent with mild atherosclerosis not resulting in a significant stenosis. Patent right PICA, bilateral AICA, and bilateral SCA origins are visualized. The basilar artery is widely patent. There are large right and diminutive or absent left posterior communicating arteries with  hypoplasia of the right P1 segment. Both PCAs are patent without evidence of a significant proximal stenosis. No aneurysm is identified. Venous sinuses: As permitted by contrast timing, patent. Anatomic variants: Fetal right PCA. Review of the MIP images confirms the above findings IMPRESSION: 1. Mild atherosclerosis in the head and neck without a large vessel occlusion or significant proximal stenosis. 2. 2 mm protrusion from the left supraclinoid ICA, favor infundibulum over aneurysm. 3.  Aortic Atherosclerosis (ICD10-I70.0). Electronically Signed   By: Dasie Hamburg M.D.   On: 08/12/2023 17:51   CT HEAD CODE STROKE WO CONTRAST Result Date: 08/12/2023 CLINICAL DATA:  Code stroke. Neuro deficit, acute, stroke suspected. Dizziness and unsteady gait. EXAM: CT HEAD WITHOUT CONTRAST TECHNIQUE: Contiguous axial images were obtained from the base of the skull through the vertex without intravenous contrast. RADIATION DOSE REDUCTION: This exam was performed according to the departmental dose-optimization program which includes automated exposure control, adjustment of the mA and/or kV according to patient size and/or use of iterative reconstruction technique. COMPARISON:  None Available. FINDINGS: Brain: No acute cortically based infarct, intracranial hemorrhage, mass, midline shift, or extra-axial fluid collection is identified. Patchy to confluent hypodensities in the cerebral white matter are nonspecific but compatible with moderate to severe chronic small vessel ischemic disease. 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 Mild cerebral atrophy is within normal limits for age. Vascular: Calcified atherosclerosis at the skull base. No hyperdense vessel. Skull: No fracture or suspicious lesion. Sinuses/Orbits: Clear paranasal sinuses. Moderate left mastoid effusion. Bilateral cataract extraction. Other: None.  ASPECTS (Alberta Stroke Program Early CT Score) - Ganglionic level infarction (caudate, lentiform nuclei, internal capsule, insula, M1-M3 cortex): 7 - Supraganglionic infarction (M4-M6 cortex): 3 Total score (0-10 with 10 being normal): 10 These results were communicated to Dr. Michaela at 4:06 pm on 08/12/2023 by text page via the [facility] messaging system. IMPRESSION: 1. No evidence of acute intracranial abnormality. ASPECTS of 10. 2. Moderate to severe chronic small vessel ischemic disease. Electronically Signed   By: Dasie Hamburg M.D.   On: 08/12/2023 16:06     Procedures   Medications Ordered in the ED  clopidogrel  (PLAVIX ) tablet 75 mg (has no administration in time range)  clopidogrel  (PLAVIX ) tablet 300 mg (300 mg Oral Given 08/12/23 1626)  aspirin  tablet 325 mg (325 mg Oral Given 08/12/23 1626)  iohexol  (OMNIPAQUE ) 350 MG/ML injection 75 mL (75 mLs Intravenous Contrast Given 08/12/23 1707)  Medical Decision Making Amount and/or Complexity of Data Reviewed Labs: ordered. Radiology: ordered.  Risk Decision regarding hospitalization.   This patient presents to the ED for concern of stumbling gait, this involves an extensive number of treatment options, and is a complaint that carries with it a high risk of complications and morbidity.  I considered the following differential and admission for this acute, potentially life threatening condition.  The differential diagnosis includes stroke, TIA, brain mass, intracerebral hemorrhage, dehydration, electrolyte abnormality, anemia, infection  MDM:    Patient is a 78 year old female who presents with a stumbling gait.  It started somewhere between 11 AM and 1230 this morning.  She was just at the 4-1/2-hour tPA window.  It was kind of unclear initially if she was still having the symptoms so code stroke was activated.  However now it seems like her symptoms have resolved.  She does not have any other  associated symptoms.  She does not really describe a vertiginous effect.  She denies any lightheadedness.  Apparently she was a bit pale on arrival.  Labs reviewed and are nonconcerning.  CT scan does not show any acute abnormality.  CTA does not show any evidence of LVO or other significant stenosis.  Dr. Michaela with neurology has evaluated the patient and recommends admission for TIA workup.  She does have some vertical nystagmus on my exam.  Discussed with Dr. Celinda who will admit the patient for further treatment.  CRITICAL CARE Performed by: Andrea Ness Total critical care time: 60 minutes Critical care time was exclusive of separately billable procedures and treating other patients. Critical care was necessary to treat or prevent imminent or life-threatening deterioration. Critical care was time spent personally by me on the following activities: development of treatment plan with patient and/or surrogate as well as nursing, discussions with consultants, evaluation of patient's response to treatment, examination of patient, obtaining history from patient or surrogate, ordering and performing treatments and interventions, ordering and review of laboratory studies, ordering and review of radiographic studies, pulse oximetry and re-evaluation of patient's condition.   (Labs, imaging, consults)  Labs: I Ordered, and personally interpreted labs.  The pertinent results include: Electrolytes nonconcerning, normal hemoglobin  Imaging Studies ordered: I ordered imaging studies including CT, CTA I independently visualized and interpreted imaging. I agree with the radiologist interpretation  Additional history obtained from chart.  External records from outside source obtained and reviewed including prior notes  Cardiac Monitoring: The patient was maintained on a cardiac monitor.  If on the cardiac monitor, I personally viewed and interpreted the cardiac monitored which showed an underlying  rhythm of: Sinus rhythm  Reevaluation: After the interventions noted above, I reevaluated the patient and found that they have :improved  Social Determinants of Health:    Disposition: Admit to hospital  Co morbidities that complicate the patient evaluation  Past Medical History:  Diagnosis Date   Dysrhythmia      Medicines Meds ordered this encounter  Medications   clopidogrel  (PLAVIX ) tablet 300 mg   clopidogrel  (PLAVIX ) tablet 75 mg   aspirin  tablet 325 mg   iohexol  (OMNIPAQUE ) 350 MG/ML injection 75 mL    I have reviewed the patients home medicines and have made adjustments as needed  Problem List / ED Course: Problem List Items Addressed This Visit   None Visit Diagnoses       TIA (transient ischemic attack)    -  Primary   Relevant Medications   aspirin  tablet 325 mg (Completed)  Final diagnoses:  TIA (transient ischemic attack)    ED Discharge Orders     None          Lenor Hollering, MD 08/12/23 [phone]

## 2023-08-13 ENCOUNTER — Other Ambulatory Visit (HOSPITAL_COMMUNITY): Payer: Self-pay

## 2023-08-13 ENCOUNTER — Inpatient Hospital Stay (HOSPITAL_COMMUNITY)

## 2023-08-13 DIAGNOSIS — E785 Hyperlipidemia, unspecified: Secondary | ICD-10-CM | POA: Diagnosis not present

## 2023-08-13 DIAGNOSIS — R29818 Other symptoms and signs involving the nervous system: Secondary | ICD-10-CM | POA: Diagnosis not present

## 2023-08-13 DIAGNOSIS — I639 Cerebral infarction, unspecified: Secondary | ICD-10-CM | POA: Diagnosis present

## 2023-08-13 DIAGNOSIS — G459 Transient cerebral ischemic attack, unspecified: Secondary | ICD-10-CM | POA: Diagnosis not present

## 2023-08-13 DIAGNOSIS — I6782 Cerebral ischemia: Secondary | ICD-10-CM | POA: Diagnosis not present

## 2023-08-13 LAB — COMPREHENSIVE METABOLIC PANEL WITH GFR
ALT: 10 U/L (ref 0–44)
AST: 17 U/L (ref 15–41)
Albumin: 3.3 g/dL — ABNORMAL LOW (ref 3.5–5.0)
Alkaline Phosphatase: 56 U/L (ref 38–126)
Anion gap: 10 (ref 5–15)
BUN: 10 mg/dL (ref 8–23)
CO2: 24 mmol/L (ref 22–32)
Calcium: 8.9 mg/dL (ref 8.9–10.3)
Chloride: 105 mmol/L (ref 98–111)
Creatinine, Ser: 0.79 mg/dL (ref 0.44–1.00)
GFR, Estimated: >60 mL/min (ref >60)
Glucose, Bld: 89 mg/dL (ref 70–99)
Potassium: 3.7 mmol/L (ref 3.5–5.1)
Sodium: 139 mmol/L (ref 135–145)
Total Bilirubin: 0.7 mg/dL (ref 0.0–1.2)
Total Protein: 6.2 g/dL — ABNORMAL LOW (ref 6.5–8.1)

## 2023-08-13 LAB — CBC
HCT: 38.1 % (ref 36.0–46.0)
Hemoglobin: 12 g/dL (ref 12.0–15.0)
MCH: 28.6 pg (ref 26.0–34.0)
MCHC: 31.5 g/dL (ref 30.0–36.0)
MCV: 90.9 fL (ref 80.0–100.0)
Platelets: 214 K/uL (ref 150–400)
RBC: 4.19 MIL/uL (ref 3.87–5.11)
RDW: 13.4 % (ref 11.5–15.5)
WBC: 7.2 K/uL (ref 4.0–10.5)
nRBC: 0 % (ref 0.0–0.2)

## 2023-08-13 LAB — ECHOCARDIOGRAM COMPLETE
AR max vel: 0.76 cm2
AV Area VTI: 0.81 cm2
AV Area mean vel: 0.78 cm2
AV Mean grad: 39.7 mmHg
AV Peak grad: 72.3 mmHg
Ao pk vel: 4.25 m/s
Area-P 1/2: 2.2 cm2
Height: 63 in
MV VTI: 1.93 cm2
S' Lateral: 3 cm
Weight: 2896 [oz_av]

## 2023-08-13 LAB — HEMOGLOBIN A1C
Hgb A1c MFr Bld: 5.2 % (ref 4.8–5.6)
Mean Plasma Glucose: 102.54 mg/dL

## 2023-08-13 LAB — LIPID PANEL
Cholesterol: 148 mg/dL (ref 0–200)
HDL: 53 mg/dL (ref >40)
LDL Cholesterol: 81 mg/dL (ref 0–99)
Total CHOL/HDL Ratio: 2.8 ratio
Triglycerides: 68 mg/dL (ref <150)
VLDL: 14 mg/dL (ref 0–40)

## 2023-08-13 MED ORDER — ATORVASTATIN CALCIUM 10 MG PO TABS
20.0000 mg | ORAL_TABLET | Freq: Every day | ORAL | Status: DC
  Administered 2023-08-13: 20 mg via ORAL
  Filled 2023-08-13: qty 2

## 2023-08-13 MED ORDER — ASPIRIN 81 MG PO TBEC
81.0000 mg | DELAYED_RELEASE_TABLET | Freq: Every day | ORAL | 0 refills | Status: AC
  Filled 2023-08-13: qty 90, 90d supply, fill #0

## 2023-08-13 MED ORDER — CLOPIDOGREL BISULFATE 75 MG PO TABS
75.0000 mg | ORAL_TABLET | Freq: Every day | ORAL | 0 refills | Status: AC
  Filled 2023-08-13: qty 21, 21d supply, fill #0

## 2023-08-13 MED ORDER — ATORVASTATIN CALCIUM 20 MG PO TABS
20.0000 mg | ORAL_TABLET | Freq: Every day | ORAL | 0 refills | Status: DC
  Filled 2023-08-13: qty 90, 90d supply, fill #0

## 2023-08-13 NOTE — Care Management Obs Status (Signed)
 MEDICARE OBSERVATION STATUS NOTIFICATION   Patient Details  Name: NAYAH LUKENS MRN: [id number] Date of Birth: April 28, 1945   Medicare Observation Status Notification Given:  Yes    Waddell Barnie Rama, RN 08/13/2023, 3:06 PM

## 2023-08-13 NOTE — Plan of Care (Signed)

## 2023-08-13 NOTE — TOC CAGE-AID Note (Signed)
 Transition of Care [facility]) - CAGE-AID Screening   Patient Details  Name: Julia Bowman MRN: [id number] Date of Birth: 12-24-1945  [facility] of [facility]) CM/SW Contact:    Achaia Garlock E Fay Swider, LCSW Phone Number: 08/13/2023, 3:59 PM   Clinical Narrative:    CAGE-AID Screening:    Have You Ever Felt You Ought to Cut Down on Your Drinking or Drug Use?: No Have People Annoyed You By [employer] Your Drinking Or Drug Use?: No Have You Felt Bad Or Guilty About Your Drinking Or Drug Use?: No Have You Ever Had a Drink or Used Drugs First Thing In The Morning to Steady Your Nerves or to Get Rid of a Hangover?: No CAGE-AID Score: 0  Substance Abuse Education Offered: No

## 2023-08-13 NOTE — Progress Notes (Signed)
 AVS instructions given, patient verbalized understanding, will wheel her to the main Entrance A to friend's car

## 2023-08-13 NOTE — Evaluation (Signed)
 Physical Therapy Evaluation Patient Details Name: Julia Bowman MRN: [id number] DOB: 06-Nov-1945 Today's Date: 08/13/2023  History of Present Illness  78 y.o. female [facility]23 from ILF with ataxia and dizziness, code stroke. MRI Brain showed punctate acute infarct in L postcentral gyrus vs artifact and remote R frontal and R occipital infarcts and moderate/severe chronic microvascular ischemic change. PMHx: dysrhythmia   Clinical Impression  Pt in bed upon arrival and agreeable to PT session. PTA, pt was independent for mobility with no AD. Pt was able to ambulate >257ft with no AD and supervision/ModI. Pt scored a 21/24 on the DGI, indicating that pt is not at an increased risk of falling. Pt reported being at functional mobility baseline and is looking forward to going home. Pt has no further questions or concerns about mobility. Acute PT signing off. Please re-consult if new needs arise.         If plan is discharge home, recommend the following: Assist for transportation   Can travel by private vehicle    Yes    Equipment Recommendations None recommended by PT     Functional Status Assessment Patient has had a recent decline in their functional status and demonstrates the ability to make significant improvements in function in a reasonable and predictable amount of time.     Precautions / Restrictions Precautions Precautions: Fall Restrictions Weight Bearing Restrictions Per Provider Order: No      Mobility  Bed Mobility Overal bed mobility: Independent     Transfers Overall transfer level: Modified independent Equipment used: None       Ambulation/Gait Ambulation/Gait assistance: Modified independent (Device/Increase time), Supervision Gait Distance (Feet): 250 Feet Assistive device: None Gait Pattern/deviations: Step-through pattern, Decreased stride length Gait velocity: decr     General Gait Details: steady gait with no overt  LOB  Stairs Stairs: Yes Stairs assistance: Contact guard assist Stair Management: No rails, Step to pattern, Forwards Number of Stairs: 3 General stair comments: CGA for safety with step-to pattern and no rails  Modified Rankin (Stroke Patients Only) Modified Rankin (Stroke Patients Only) Pre-Morbid Rankin Score: No symptoms Modified Rankin: No symptoms     Balance Overall balance assessment: Needs assistance Sitting-balance support: No upper extremity supported, Feet supported Sitting balance-Leahy Scale: Normal     Standing balance support: No upper extremity supported Standing balance-Leahy Scale: Good  Standardized Balance Assessment Standardized Balance Assessment : Dynamic Gait Index   Dynamic Gait Index Level Surface: Normal Change in Gait Speed: Normal Gait with Horizontal Head Turns: Normal Gait with Vertical Head Turns: Normal Gait and Pivot Turn: Normal Step Over Obstacle: Normal Step Around Obstacles: Mild Impairment Steps: Moderate Impairment Total Score: 21       Pertinent Vitals/Pain Pain Assessment Pain Assessment: No/denies pain    Home Living Family/patient expects to be discharged to:: Private residence Living Arrangements: Alone Available Help at Discharge: Friend(s);Available PRN/intermittently Type of Home: Independent living facility Home Access: Level entry    Home Layout: One level Home Equipment: Shower seat;Grab bars - tub/shower;Grab bars - toilet;Cane - single point;Rolling Walker (2 wheels);Electric scooter      Prior Function Prior Level of Function : Independent/Modified Independent;Driving    Mobility Comments: Ind with no AD ADLs Comments: Meals provided     Extremity/Trunk Assessment   Upper Extremity Assessment Upper Extremity Assessment: Defer to OT evaluation    Lower Extremity Assessment Lower Extremity Assessment: Overall WFL for tasks assessed    Cervical / Trunk Assessment Cervical / Trunk  Assessment:  Normal  Communication   Communication Communication: No apparent difficulties    Cognition Arousal: Alert Behavior During Therapy: WFL for tasks assessed/performed   PT - Cognitive impairments: No apparent impairments    Following commands: Intact       Cueing Cueing Techniques: Verbal cues, Tactile cues     General Comments General comments (skin integrity, edema, etc.): VSS on RA     PT Assessment Patient does not need any further PT services   AM-PAC PT 6 Clicks Mobility  Outcome Measure Help needed turning from your back to your side while in a flat bed without using bedrails?: None Help needed moving from lying on your back to sitting on the side of a flat bed without using bedrails?: None Help needed moving to and from a bed to a chair (including a wheelchair)?: None Help needed standing up from a chair using your arms (e.g., wheelchair or bedside chair)?: None Help needed to walk in hospital room?: A Little Help needed climbing 3-5 steps with a railing? : A Little 6 Click Score: 22    End of Session   Activity Tolerance: Patient tolerated treatment well Patient left: in bed;with call bell/phone within reach Nurse Communication: Mobility status PT Visit Diagnosis: Other abnormalities of gait and mobility (R26.89)    Time: 9193-9176 PT Time Calculation (min) (ACUTE ONLY): 17 min   Charges:   PT Evaluation $PT Eval Low Complexity: 1 Low   PT General Charges $$ ACUTE PT VISIT: 1 Visit        Kate ORN, PT, DPT Secure Chat Preferred  Rehab Office [phone]   Kate BRAVO [name] 08/13/2023, 8:53 AM

## 2023-08-13 NOTE — Evaluation (Signed)
 Speech Language Pathology Evaluation Patient Details Name: Julia Bowman MRN: [id number] DOB: 01-22-1946 Today's Date: 08/13/2023 Time: 8995-8980 SLP Time Calculation (min) (ACUTE ONLY): 15 min  Problem List:  Patient Active Problem List   Diagnosis Date Noted   TIA (transient ischemic attack) 08/12/2023   Past Medical History:  Past Medical History:  Diagnosis Date   Dysrhythmia    Past Surgical History:  Past Surgical History:  Procedure Laterality Date   ABDOMINAL HYSTERECTOMY     BREAST BIOPSY     COLONOSCOPY WITH PROPOFOL  N/A 01/17/2015   Procedure: COLONOSCOPY WITH PROPOFOL ;  Surgeon: Elsie Cree, MD;  Location: [facility];  Service: Endoscopy;  Laterality: N/A;   HAND SURGERY     STOMACH SURGERY     TONSILLECTOMY     HPI:  78 y.o. female presents to [facility]/12/23 from ILF with ataxia and dizziness, code stroke. MRI Brain showed punctate acute infarct in L postcentral gyrus vs artifact and remote R frontal and R occipital infarcts and moderate/severe chronic microvascular ischemic change. PMHx: dysrhythmia   Assessment / Plan / Recommendation Clinical Impression  Patient presents with cognitive-linguistic function deficits aligned with patient's description of baseline performance. Patient reports changes to memory at baseline and notes that since admission, she has noticed no differences. Good friend present and bedside and corroborates these reports. SLP administered SLUMS examination with patient scoring 16/30, though majority of points lost were d/t delayed memory and working memory deficits. Patient appears to be an accurate historian and relays information re: current status and hospital stay accurately. Patient demonstrates utilization of compensatory memory strategies during session independently. No further SLP follow up indicated at this level of care, however recommend patient seek outpatient therapy services should she notice continued or rapid worsening in  memory function once back at home. Patient verbalizes understanding.    SLP Assessment  SLP Recommendation/Assessment: Patient does not need any further Speech Language Pathology Services SLP Visit Diagnosis: Cognitive communication deficit (R41.841)     Assistance Recommended at Discharge  PRN  Functional Status Assessment Patient has not had a recent decline in their functional status  Frequency and Duration           SLP Evaluation Cognition  Overall Cognitive Status: History of cognitive impairments - at baseline Arousal/Alertness: Awake/alert Orientation Level: Oriented X4 Year: 2025 Month: July Day of Week: Incorrect Attention: Sustained Sustained Attention: Appears intact Memory: Impaired Memory Impairment: Retrieval deficit Awareness: Appears intact Problem Solving: Impaired Problem Solving Impairment: Functional complex;Verbal complex Safety/Judgment: Appears intact Comments: working memory impairment       Community education officer Comprehension Overall Auditory Comprehension: Appears within functional limits for tasks assessed Visual Recognition/Discrimination Discrimination: Within Function Limits Reading Comprehension Reading Status: Within funtional limits    Expression Expression Primary Mode of Expression: Verbal Verbal Expression Overall Verbal Expression: Appears within functional limits for tasks assessed Initiation: No impairment   Oral / Motor  Oral Motor/Sensory Function Overall Oral Motor/Sensory Function: Within functional limits Motor Speech Overall Motor Speech: Appears within functional limits for tasks assessed Intelligibility: Intelligible Motor Planning: Within functional limits Motor Speech Errors: Not applicable           Rosina Downy, M.A., CCC-SLP  Rosina A Bobbe Quilter 08/13/2023, 11:30 AM

## 2023-08-13 NOTE — TOC Transition Note (Signed)
 Transition of Care [facility]) - Discharge Note   Patient Details  Name: JOLA CRITZER MRN: [id number] Date of Birth: [birth year]  Transition of [facility]) CM/SW Contact:  Waddell Barnie Rama, RN Phone Number: 08/13/2023, 1:45 PM   Clinical Narrative:    For possible dc today, she has transport home. TOC pharmacy will fill meds.          Patient Goals and CMS Choice            Discharge Placement                       Discharge Plan and Services Additional resources added to the After Visit Summary for                                       Social Drivers of Health (SDOH) Interventions SDOH Screenings   Food Insecurity: No Food Insecurity (08/12/2023)  Housing: Low Risk  (08/12/2023)  Transportation Needs: No Transportation Needs (08/12/2023)  Utilities: Not At Risk (08/12/2023)  Social Connections: Moderately Isolated (08/12/2023)  Tobacco Use: Unknown (08/12/2023)     Readmission Risk Interventions    08/13/2023    1:24 PM  Readmission Risk Prevention Plan  Post Dischage Appt Complete  Medication Screening Complete  Transportation Screening Complete

## 2023-08-13 NOTE — Progress Notes (Addendum)
 STROKE TEAM PROGRESS NOTE   INTERIM HISTORY/SUBJECTIVE  Family at the bedside.  Patient states yesterday she could not focus she had blurred vision was dizzy and felt off balance symptoms lasted about a few hours today she feels back to normal  MRI brain is negative for definite stroke.  Tiny diffusion hyperintensity in the left frontal is likely an artifact CT angiogram showed no large vessel occlusion or severe stenosis.  CBC    Component Value Date/Time   WBC 7.2 08/13/2023 0350   RBC 4.19 08/13/2023 0350   HGB 12.0 08/13/2023 0350   HCT 38.1 08/13/2023 0350   PLT 214 08/13/2023 0350   MCV 90.9 08/13/2023 0350   MCH 28.6 08/13/2023 0350   MCHC 31.5 08/13/2023 0350   RDW 13.4 08/13/2023 0350   LYMPHSABS 1.6 08/12/2023 1552   MONOABS 0.4 08/12/2023 1552   EOSABS 0.1 08/12/2023 1552   BASOSABS 0.0 08/12/2023 1552    BMET    Component Value Date/Time   NA 139 08/13/2023 0350   K 3.7 08/13/2023 0350   CL 105 08/13/2023 0350   CO2 24 08/13/2023 0350   GLUCOSE 89 08/13/2023 0350   BUN 10 08/13/2023 0350   CREATININE 0.79 08/13/2023 0350   CALCIUM  8.9 08/13/2023 0350   GFRNONAA >60 08/13/2023 0350    IMAGING past 24 hours MR BRAIN WO CONTRAST Result Date: 08/13/2023 CLINICAL DATA:  Neuro deficit, acute, stroke suspected EXAM: MRI HEAD WITHOUT CONTRAST TECHNIQUE: Multiplanar, multiecho pulse sequences of the brain and surrounding structures were obtained without intravenous contrast. COMPARISON:  CT head 08/12/2023. FINDINGS: Brain: Punctate focus of DWI hyperintensity in the left postcentral gyrus (series 14, image 91) which is difficult to confirm on ADC. No acute hemorrhage, hydrocephalus, extra-axial collection or mass lesion. Moderate to severe patchy and confluent T2/FLAIR hyperintensities in the white matter, compatible with chronic microvascular ischemic disease. Remote right frontal and right occipital infarcts. Small remote left cerebellar infarct. Vascular: Major  arterial flow voids are maintained at the skull base. Skull and upper cervical spine: Normal marrow signal. Sinuses/Orbits: Negative. IMPRESSION: 1. Punctate acute infarct in the left postcentral gyrus versus artifact. 2. Remote right frontal and right occipital infarcts and moderate to severe chronic microvascular ischemic change. Electronically Signed   By: Gilmore GORMAN Molt M.D.   On: 08/13/2023 00:52   DG Chest Port 1 View Result Date: 08/12/2023 CLINICAL DATA:  Recent stroke-like symptoms, initial encounter EXAM: PORTABLE CHEST 1 VIEW COMPARISON:  None Available. FINDINGS: Cardiac shadow is mildly prominent but accentuated by the portable technique. Lungs are clear. No bony abnormality is noted. IMPRESSION: No acute abnormality noted. Electronically Signed   By: Oneil Devonshire M.D.   On: 08/12/2023 22:27   CT ANGIO HEAD NECK W WO CM Result Date: 08/12/2023 CLINICAL DATA:  Stroke, follow up.  Dizziness and unsteady gait. EXAM: CT ANGIOGRAPHY HEAD AND NECK WITH AND WITHOUT CONTRAST TECHNIQUE: Multidetector CT imaging of the head and neck was performed using the standard protocol during bolus administration of intravenous contrast. Multiplanar CT image reconstructions and MIPs were obtained to evaluate the vascular anatomy. Carotid stenosis measurements (when applicable) are obtained utilizing NASCET criteria, using the distal internal carotid diameter as the denominator. RADIATION DOSE REDUCTION: This exam was performed according to the departmental dose-optimization program which includes automated exposure control, adjustment of the mA and/or kV according to patient size and/or use of iterative reconstruction technique. CONTRAST:  75mL OMNIPAQUE  IOHEXOL  350 MG/ML SOLN COMPARISON:  None Available. FINDINGS: CTA NECK FINDINGS Aortic  arch: Standard branching. Mild atherosclerosis. Patent brachiocephalic and subclavian arteries with calcified and soft plaque in the proximal left subclavian artery not resulting  in a significant stenosis. Right carotid system: Patent with a small amount of calcified plaque at the carotid bifurcation. No evidence of a significant stenosis or dissection. Left carotid system: Patent with a small amount of calcified plaque at the carotid bifurcation. No evidence of a significant stenosis or dissection. Vertebral arteries: Patent without evidence stenosis or dissection. Codominant. Skeleton: Moderate lower cervical disc degeneration. Widespread advanced facet arthrosis in the cervical and upper thoracic spine. Severe multilevel neural foraminal stenosis. No evidence of high-grade spinal canal stenosis. Other neck: No evidence cervical lymphadenopathy or mass. Upper chest: Clear lung apices. Review of the MIP images confirms the above findings CTA HEAD FINDINGS Anterior circulation: The internal carotid arteries are patent from skull base to carotid termini with mild atherosclerosis bilaterally not resulting in a significant stenosis. There is a 2 mm protrusion from the left supraclinoid ICA in the posterior communicating region, likely with a tiny vessel arising from its apex on coronal images. ACAs and MCAs are patent without evidence of a proximal branch occlusion or significant proximal stenosis. Posterior circulation: The intracranial vertebral arteries are patent with mild atherosclerosis not resulting in a significant stenosis. Patent right PICA, bilateral AICA, and bilateral SCA origins are visualized. The basilar artery is widely patent. There are large right and diminutive or absent left posterior communicating arteries with hypoplasia of the right P1 segment. Both PCAs are patent without evidence of a significant proximal stenosis. No aneurysm is identified. Venous sinuses: As permitted by contrast timing, patent. Anatomic variants: Fetal right PCA. Review of the MIP images confirms the above findings IMPRESSION: 1. Mild atherosclerosis in the head and neck without a large vessel  occlusion or significant proximal stenosis. 2. 2 mm protrusion from the left supraclinoid ICA, favor infundibulum over aneurysm. 3.  Aortic Atherosclerosis (ICD10-I70.0). Electronically Signed   By: Dasie Hamburg M.D.   On: 08/12/2023 17:51   CT HEAD CODE STROKE WO CONTRAST Result Date: 08/12/2023 CLINICAL DATA:  Code stroke. Neuro deficit, acute, stroke suspected. Dizziness and unsteady gait. EXAM: CT HEAD WITHOUT CONTRAST TECHNIQUE: Contiguous axial images were obtained from the base of the skull through the vertex without intravenous contrast. RADIATION DOSE REDUCTION: This exam was performed according to the departmental dose-optimization program which includes automated exposure control, adjustment of the mA and/or kV according to patient size and/or use of iterative reconstruction technique. COMPARISON:  None Available. FINDINGS: Brain: No acute cortically based infarct, intracranial hemorrhage, mass, midline shift, or extra-axial fluid collection is identified. Patchy to confluent hypodensities in the cerebral white matter are nonspecific but compatible with moderate to severe chronic small vessel ischemic disease. 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 Mild cerebral atrophy is within normal limits for age. Vascular: Calcified atherosclerosis at the skull base. No hyperdense vessel. Skull: No fracture or suspicious lesion. Sinuses/Orbits: Clear paranasal sinuses. Moderate left mastoid effusion. Bilateral cataract extraction. Other: None. ASPECTS ([place]ta Stroke Program Early CT Score) - Ganglionic level infarction (caudate, lentiform nuclei, internal capsule, insula, M1-M3 cortex): 7 - Supraganglionic infarction (M4-M6 cortex): 3 Total score (0-10 with 10 being normal): 10 These results were communicated to Dr. Michaela at 4:06 pm on 08/12/2023 by text page via the [facility] messaging system.  IMPRESSION: 1. No evidence of acute intracranial abnormality. ASPECTS of 10.  2. Moderate to severe chronic small vessel ischemic disease. Electronically Signed   By: Dasie Hamburg M.D.   On: 08/12/2023 16:06    Vitals:   08/12/23 1945 08/12/23 2040 08/13/23 0330 08/13/23 0714  BP: (!) 140/96 (!) 174/95 132/74 (!) 150/77  Pulse: 72 89 73 72  Resp: 17 16 16 18   Temp: 97.7 F (36.5 C) 98 F (36.7 C) 98 F (36.7 C) 97.7 F (36.5 C)  TempSrc: Oral Oral Oral Oral  SpO2: 100% 99% 97% 93%  Weight:      Height:         PHYSICAL EXAM General:  Alert, well-nourished, well-developed elderly lady in no acute distress Psych:  Mood and affect appropriate for situation CV: Regular rate and rhythm on monitor Respiratory:  Regular, unlabored respirations on room air GI: Abdomen soft and nontender   NEURO:  Mental Status: AA&Ox3, patient is able to give clear and coherent history Speech/Language: speech is without dysarthria or aphasia.  Naming, repetition, fluency, and comprehension intact.  Cranial Nerves:  II: PERRL. Visual fields full.  III, IV, VI: EOMI. Eyelids elevate symmetrically.  V: Sensation is intact to light touch and symmetrical to face.  VII: Face is symmetrical resting and smiling VIII: hearing intact to voice. IX, X: Palate elevates symmetrically. Phonation is normal.  KP:Dynloizm shrug 5/5. XII: tongue is midline without fasciculations. Motor: 5/5 strength to all muscle groups tested.  Tone: is normal and bulk is normal Sensation- Intact to light touch bilaterally. Extinction absent to light touch to DSS.   Coordination: FTN intact bilaterally, HKS: no ataxia in BLE.No drift.  Gait- deferred  Most Recent NIH 0   ASSESSMENT/PLAN  Julia Bowman is a 78 y.o. female with history of significant of aortic stenosis and HLD  admitted for mild confusion and had trouble focusing and dizziness.  NIH on Admission 0  Posterior circulation TIA Code Stroke CT head No  acute abnormality. Small vessel disease. ASPECTS 10.    CTA head & neck Mild atherosclerosis in the head and neck without a large vessel occlusion or significant proximal stenosis.  2 mm protrusion from the left supraclinoid ICA, favor infundibulum over aneurysm. MRI   Punctate diffusion hyperintensity in the left postcentral gyrus felt likely to be artifact. 2. Remote right frontal and right occipital infarcts and moderate to severe chronic microvascular ischemic change. 2D Echo ordered LDL 81 HgbA1c 5.2 VTE prophylaxis -SCDs No antithrombotic prior to admission, now on aspirin  81 mg daily and clopidogrel  75 mg daily for 3 weeks and then aspirin  alone. Therapy recommendations:  Pending Disposition: Pending  Hx of Stroke/TIA Remote right frontal and right occipital infarcts   Hyperlipidemia Home meds: None LDL 81, goal < 70 Add atorvastatin  20 mg Continue statin at discharge  Dysphagia Patient has post-stroke dysphagia, SLP consulted    Diet   Diet Heart Room service appropriate? Yes; Fluid consistency: Thin   Advance diet as tolerated  Other Stroke Risk Factors Obesity, Body mass index is 32.06 kg/m., BMI >/= 30 associated with increased stroke risk, recommend weight loss, diet and exercise as appropriate    Other Active Problems   Hospital day # 1  Julia Geralds DNP, ACNPC-AG  [facility]  I have personally obtained history,examined this patient, reviewed notes, independently viewed imaging studies, participated in medical decision making and plan of care.ROS completed by me personally and pertinent positives fully documented  I have made any additions or clarifications directly to the above note. Agree with  note above.  Patient presented with sudden onset of dizziness blurred vision and off-balance sensation lasting a few hours likely posterior circulation TIA.  MRI is negative for definite stroke but there is a punctate diffusion hyperintensity over the left  frontal cortex which is likely artifact.  Continue ongoing stroke workup.  Mobilize out of bed.  Therapy consults.  Recommend aspirin  Plavix  for 3 weeks followed by aspirin  alone and aggressive risk factor modification.skip Eather Popp, MD Medical Director [facility] Pager: [phone] 08/13/2023 3:38 PM   To contact Stroke Continuity provider, please refer to [email or web address]. After hours, contact General Neurology

## 2023-08-13 NOTE — Progress Notes (Signed)
 OT Cancellation Note  Patient Details Name: MIKEYLA MUSIC MRN: [id number] DOB: 1945-02-01   Cancelled Treatment:    Reason Eval/Treat Not Completed: OT screened, no needs identified, will sign off Per pt and PT, at baseline for ADLs/mobility. Pt confirms no new issues with vision and no concerns managing at ILF.   Mliss Fish 08/13/2023, 12:08 PM

## 2023-08-13 NOTE — Care Management CC44 (Signed)
 Condition Code 44 Documentation Completed  Patient Details  Name: Julia Bowman MRN: [id number] Date of Birth: 10/26/1945   Condition Code 44 given:    Patient signature on Condition Code 44 notice:    Documentation of 2 MD's agreement:    Code 44 added to claim:       Waddell Barnie Rama, RN 08/13/2023, 3:07 PM

## 2023-08-13 NOTE — Discharge Summary (Signed)
 Physician Discharge Summary  BEVERELY SUEN FMW:[id number] DOB: 29-Jan-1945 DOA: 08/12/2023  PCP: Zackary Katheryn LELON, PA-C  Admit date: 08/12/2023 Discharge date: 08/13/2023  Admitted From: ILF Disposition:  ILF  Recommendations for Outpatient Follow-up:  Follow up with PCP in 1-2 weeks Establish care with cardiology for AS  Home Health: none Equipment/Devices: none  Discharge Condition: stable CODE STATUS: Full code Diet Orders (From admission, onward)     Start     Ordered   08/12/23 2219  Diet Heart Room service appropriate? Yes; Fluid consistency: Thin  Diet effective now       Question Answer Comment  Room service appropriate? Yes   Fluid consistency: Thin      08/12/23 2218            HPI:  78 y.o. female with medical history significant of aortic stenosis HLD, Presented with ataxia and disequilibrium Presents with dizziness and unsteady gait trouble focusing her eyes started around 11 AM today not associated nausea vomiting Code stroke activated Patient with known history of aortic stenosis hyperlipidemia no slurred speech or facial droop Symptoms lasted for 2 hours and then completely resolved.  No focal weakness or numbness.  Hospital Course / Discharge diagnoses: Principal Problem:   TIA (transient ischemic attack) Active Problems:   Acute CVA (cerebrovascular accident) [facility])   Principal problem TIA -patient was admitted to the hospital with concerns for TIA/CVA.  Neurology consulted and followed patient while hospitalized.  She underwent an MRI of the brain which showed possible punctate acute infarct in the left postcentral gyrus versus artifact.  Underwent a CT angiogram which was without any large vessel occlusions.  Lipid panel showed an LDL of 81.  A1c was 5.2.  2D echocardiogram showed LVEF 50-55%, mild concentric LVH.  RV systolic function was mildly reduced.  She was found to have severe aortic stenosis.  Case discussed with neurology, they do not feel  like this was a true stroke with may be a TIA in the posterior circulation, recommending aspirin  and Plavix  for 21 days followed by aspirin  alone.  She worked well with physical therapy, she has returned at baseline, no PT follow-up necessary.  She will be discharged home in stable condition  Active problems HLD -was placed on statin AS - Patient had moderate aortic stenosis back in 2016 at her prior echo, now severe.  She is able to do all her ADLs without significant dyspnea, chest pain, this can be followed up as an outpatient, discussed with cardiology coordinator and will have an appointment set up soon   Sepsis ruled out   Discharge Instructions   Allergies as of 08/13/2023   No Known Allergies      Medication List     TAKE these medications    aspirin  EC 81 MG tablet Take 1 tablet (81 mg total) by mouth daily. Swallow whole. Start taking on: August 14, 2023   atorvastatin  20 MG tablet Commonly known as: LIPITOR Take 1 tablet (20 mg total) by mouth daily. Start taking on: August 14, 2023   clopidogrel  75 MG tablet Commonly known as: PLAVIX  Take 1 tablet (75 mg total) by mouth daily for 21 days. Start taking on: August 14, 2023   cyanocobalamin  1000 MCG/ML injection Commonly known as: VITAMIN B12 Inject 1,000 mcg into the skin every 30 (thirty) days.   fluticasone  50 MCG/ACT nasal spray Commonly known as: FLONASE  Place 2 sprays into both nostrils daily. 2 sprays each nostril at night What changed:  when to take this reasons to take this additional instructions   hydroxypropyl methylcellulose / hypromellose 2.5 % ophthalmic solution Commonly known as: ISOPTO TEARS / GONIOVISC Place 1 drop into both eyes 4 (four) times daily as needed for dry eyes.   multivitamin with minerals Tabs tablet Take 1 tablet by mouth daily.   triamcinolone  55 MCG/ACT Aero nasal inhaler Commonly known as: NASACORT  Place 2 sprays into the nose daily. 2 sprays each nostril at night What  changed:  when to take this reasons to take this additional instructions        Follow-up Information     [facility] Follow up on 08/17/2023.   Why: 9:30 am for hospital follow up Contact information: [phone]        Raford Riggs, MD Follow up.   Specialty: Cardiology Why: follow up within 3-4 weeks Contact information: [address] [place] [address] [phone]         Raford Riggs, MD .   Specialty: Cardiology Contact information: [address] [place] [address] [phone]                 Consultations: Neurology   Procedures/Studies:  ECHOCARDIOGRAM COMPLETE Result Date: 08/13/2023    ECHOCARDIOGRAM REPORT   Patient Name:   Julia Bowman [facility] Date of Exam: 08/13/2023 Medical Rec #:  [id number]       Height:       63.0 in Accession #:    [id number]      Weight:       181.0 lb Date of Birth:  12-30-1945      BSA:          1.853 m Patient Age:    77 years        BP:           150/77 mmHg Patient Gender: F               HR:           74 bpm. Exam Location:  Inpatient Procedure: 2D Echo, Cardiac Doppler, Color Doppler, 3D Echo and Strain Analysis            (Both Spectral and Color Flow Doppler were utilized during            procedure). Indications:    Stroke  History:        Patient has prior history of Echocardiogram examinations, most                 recent 04/20/2014.  Sonographer:    Therisa Crouch Referring Phys: [id number] ANASTASSIA DOUTOVA IMPRESSIONS  1. Left ventricular ejection fraction, by estimation, is 50 to 55%. Left ventricular ejection fraction by 3D volume is 52 %. The left ventricle has low normal function. The left ventricle demonstrates global hypokinesis. There is mild concentric left ventricular hypertrophy. Left ventricular diastolic function could not be evaluated. The average left ventricular global longitudinal strain is -16.1 %. The global longitudinal strain is abnormal.  2. Right ventricular systolic  function is mildly reduced. The right ventricular size is normal.  3. The mitral valve is abnormal. No evidence of mitral valve regurgitation. No evidence of mitral stenosis. Severe mitral annular calcification.  4. The aortic valve is calcified. There is moderate calcification of the aortic valve. There is severe thickening of the aortic valve. Aortic valve regurgitation is mild to moderate. Severe aortic valve stenosis. Aortic valve area, by VTI measures 0.81 cm. Aortic valve mean gradient measures  39.7 mmHg. Aortic valve Vmax measures 4.25 m/s. FINDINGS  Left Ventricle: Left ventricular ejection fraction, by estimation, is 50 to 55%. Left ventricular ejection fraction by 3D volume is 52 %. The left ventricle has low normal function. The left ventricle demonstrates global hypokinesis. The average left ventricular global longitudinal strain is -16.1 %. Strain was performed and the global longitudinal strain is abnormal. The left ventricular internal cavity size was normal in size. There is mild concentric left ventricular hypertrophy. Left ventricular diastolic function could not be evaluated due to mitral annular calcification (moderate or greater). Left ventricular diastolic function could not be evaluated. Right Ventricle: The right ventricular size is normal. No increase in right ventricular wall thickness. Right ventricular systolic function is mildly reduced. Left Atrium: Left atrial size was normal in size. Right Atrium: Right atrial size was normal in size. Pericardium: There is no evidence of pericardial effusion. Mitral Valve: The mitral valve is abnormal. Severe mitral annular calcification. No evidence of mitral valve regurgitation. No evidence of mitral valve stenosis. MV peak gradient, 7.9 mmHg. The mean mitral valve gradient is 3.0 mmHg. Tricuspid Valve: The tricuspid valve is normal in structure. Tricuspid valve regurgitation is mild. Aortic Valve: DVI 0.23, severe AS. The aortic valve is  calcified. There is moderate calcification of the aortic valve. There is severe thickening of the aortic valve. Aortic valve regurgitation is mild to moderate. Severe aortic stenosis is present. Aortic valve mean gradient measures 39.7 mmHg. Aortic valve peak gradient measures 72.2 mmHg. Aortic valve area, by VTI measures 0.81 cm. Pulmonic Valve: The pulmonic valve was normal in structure. Pulmonic valve regurgitation is not visualized. Aorta: The aortic root and ascending aorta are structurally normal, with no evidence of dilitation. IAS/Shunts: No atrial level shunt detected by color flow Doppler. Additional Comments: 3D was performed not requiring image post processing on an independent workstation and was abnormal.  LEFT VENTRICLE PLAX 2D LVIDd:         4.70 cm         Diastology LVIDs:         3.00 cm         LV e' medial:    4.35 cm/s LV PW:         1.10 cm         LV E/e' medial:  16.0 LV IVS:        1.10 cm         LV e' lateral:   5.77 cm/s LVOT diam:     2.10 cm         LV E/e' lateral: 12.1 LV SV:         77 LV SV Index:   42              2D Longitudinal LVOT Area:     3.46 cm        Strain                                2D Strain GLS   -16.1 %                                Avg:                                 3D Volume EF  LV 3D EF:    Left                                             ventricul                                             ar                                             ejection                                             fraction                                             by 3D                                             volume is                                             52 %.                                LV 3D EDV:   99.25 ml                                LV 3D ESV:   47.63 ml                                 3D Volume EF:                                3D EF:        52 % RIGHT VENTRICLE            IVC RV Basal diam:  3.00 cm    IVC diam: 1.70 cm RV S  prime:     8.92 cm/s TAPSE (M-mode): 1.8 cm LEFT ATRIUM             Index LA diam:        4.00 cm 2.16 cm/m LA Vol (A2C):   65.3 ml 35.23 ml/m LA Vol (A4C):   62.7 ml 33.83 ml/m LA Biplane Vol: 63.8 ml 34.42 ml/m  AORTIC VALVE AV Area (Vmax):    0.76 cm AV Area (Vmean):   0.78 cm AV Area (VTI):     0.81 cm AV  Vmax:           425.00 cm/s AV Vmean:          293.667 cm/s AV VTI:            0.956 m AV Peak Grad:      72.2 mmHg AV Mean Grad:      39.7 mmHg LVOT Vmax:         92.75 cm/s LVOT Vmean:        66.550 cm/s LVOT VTI:          0.224 m LVOT/AV VTI ratio: 0.23  AORTA Ao Root diam: 3.10 cm Ao Asc diam:  3.80 cm MITRAL VALVE                TRICUSPID VALVE MV Area (PHT): 2.20 cm     TV Peak grad:   13.5 mmHg MV Area VTI:   1.93 cm     TV Vmax:        1.84 m/s MV Peak grad:  7.9 mmHg MV Mean grad:  3.0 mmHg     SHUNTS MV Vmax:       1.40 m/s     Systemic VTI:  0.22 m MV Vmean:      79.8 cm/s    Systemic Diam: 2.10 cm MV Decel Time: 345 msec MV E velocity: 69.70 cm/s MV A velocity: 132.00 cm/s MV E/A ratio:  0.53 Morene Brownie Electronically signed by Morene Brownie Signature Date/Time: 08/13/2023/4:44:56 PM    Final    MR BRAIN WO CONTRAST Result Date: 08/13/2023 CLINICAL DATA:  Neuro deficit, acute, stroke suspected EXAM: MRI HEAD WITHOUT CONTRAST TECHNIQUE: Multiplanar, multiecho pulse sequences of the brain and surrounding structures were obtained without intravenous contrast. COMPARISON:  CT head 08/12/2023. FINDINGS: Brain: Punctate focus of DWI hyperintensity in the left postcentral gyrus (series 14, image 91) which is difficult to confirm on ADC. No acute hemorrhage, hydrocephalus, extra-axial collection or mass lesion. Moderate to severe patchy and confluent T2/FLAIR hyperintensities in the white matter, compatible with chronic microvascular ischemic disease. Remote right frontal and right occipital infarcts. Small remote left cerebellar infarct. Vascular: Major arterial flow voids are maintained  at the skull base. Skull and upper cervical spine: Normal marrow signal. Sinuses/Orbits: Negative. IMPRESSION: 1. Punctate acute infarct in the left postcentral gyrus versus artifact. 2. Remote right frontal and right occipital infarcts and moderate to severe chronic microvascular ischemic change. Electronically Signed   By: Gilmore GORMAN Molt M.D.   On: 08/13/2023 00:52   DG Chest Port 1 View Result Date: 08/12/2023 CLINICAL DATA:  Recent stroke-like symptoms, initial encounter EXAM: PORTABLE CHEST 1 VIEW COMPARISON:  None Available. FINDINGS: Cardiac shadow is mildly prominent but accentuated by the portable technique. Lungs are clear. No bony abnormality is noted. IMPRESSION: No acute abnormality noted. Electronically Signed   By: Oneil Devonshire M.D.   On: 08/12/2023 22:27   CT ANGIO HEAD NECK W WO CM Result Date: 08/12/2023 CLINICAL DATA:  Stroke, follow up.  Dizziness and unsteady gait. EXAM: CT ANGIOGRAPHY HEAD AND NECK WITH AND WITHOUT CONTRAST TECHNIQUE: Multidetector CT imaging of the head and neck was performed using the standard protocol during bolus administration of intravenous contrast. Multiplanar CT image reconstructions and MIPs were obtained to evaluate the vascular anatomy. Carotid stenosis measurements (when applicable) are obtained utilizing NASCET criteria, using the distal internal carotid diameter as the denominator. RADIATION DOSE REDUCTION: This exam was performed according to the departmental dose-optimization program which includes automated exposure control, adjustment of  the mA and/or kV according to patient size and/or use of iterative reconstruction technique. CONTRAST:  75mL OMNIPAQUE  IOHEXOL  350 MG/ML SOLN COMPARISON:  None Available. FINDINGS: CTA NECK FINDINGS Aortic arch: Standard branching. Mild atherosclerosis. Patent brachiocephalic and subclavian arteries with calcified and soft plaque in the proximal left subclavian artery not resulting in a significant stenosis. Right  carotid system: Patent with a small amount of calcified plaque at the carotid bifurcation. No evidence of a significant stenosis or dissection. Left carotid system: Patent with a small amount of calcified plaque at the carotid bifurcation. No evidence of a significant stenosis or dissection. Vertebral arteries: Patent without evidence stenosis or dissection. Codominant. Skeleton: Moderate lower cervical disc degeneration. Widespread advanced facet arthrosis in the cervical and upper thoracic spine. Severe multilevel neural foraminal stenosis. No evidence of high-grade spinal canal stenosis. Other neck: No evidence cervical lymphadenopathy or mass. Upper chest: Clear lung apices. Review of the MIP images confirms the above findings CTA HEAD FINDINGS Anterior circulation: The internal carotid arteries are patent from skull base to carotid termini with mild atherosclerosis bilaterally not resulting in a significant stenosis. There is a 2 mm protrusion from the left supraclinoid ICA in the posterior communicating region, likely with a tiny vessel arising from its apex on coronal images. ACAs and MCAs are patent without evidence of a proximal branch occlusion or significant proximal stenosis. Posterior circulation: The intracranial vertebral arteries are patent with mild atherosclerosis not resulting in a significant stenosis. Patent right PICA, bilateral AICA, and bilateral SCA origins are visualized. The basilar artery is widely patent. There are large right and diminutive or absent left posterior communicating arteries with hypoplasia of the right P1 segment. Both PCAs are patent without evidence of a significant proximal stenosis. No aneurysm is identified. Venous sinuses: As permitted by contrast timing, patent. Anatomic variants: Fetal right PCA. Review of the MIP images confirms the above findings IMPRESSION: 1. Mild atherosclerosis in the head and neck without a large vessel occlusion or significant proximal  stenosis. 2. 2 mm protrusion from the left supraclinoid ICA, favor infundibulum over aneurysm. 3.  Aortic Atherosclerosis (ICD10-I70.0). Electronically Signed   By: Dasie Hamburg M.D.   On: 08/12/2023 17:51   CT HEAD CODE STROKE WO CONTRAST Result Date: 08/12/2023 CLINICAL DATA:  Code stroke. Neuro deficit, acute, stroke suspected. Dizziness and unsteady gait. EXAM: CT HEAD WITHOUT CONTRAST TECHNIQUE: Contiguous axial images were obtained from the base of the skull through the vertex without intravenous contrast. RADIATION DOSE REDUCTION: This exam was performed according to the departmental dose-optimization program which includes automated exposure control, adjustment of the mA and/or kV according to patient size and/or use of iterative reconstruction technique. COMPARISON:  None Available. FINDINGS: Brain: No acute cortically based infarct, intracranial hemorrhage, mass, midline shift, or extra-axial fluid collection is identified. Patchy to confluent hypodensities in the cerebral white matter are nonspecific but compatible with moderate to severe chronic small vessel ischemic disease. 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 Mild cerebral atrophy is within normal limits for age. Vascular: Calcified atherosclerosis at the skull base. No hyperdense vessel. Skull: No fracture or suspicious lesion. Sinuses/Orbits: Clear paranasal sinuses. Moderate left mastoid effusion. Bilateral cataract extraction. Other: None. ASPECTS [facility] Stroke Program Early CT Score) - Ganglionic level infarction (caudate, lentiform nuclei, internal capsule, insula, M1-M3 cortex): 7 - Supraganglionic infarction (M4-M6 cortex): 3 Total score (0-10 with 10 being normal): 10 These  results were communicated to Dr. Michaela at 4:06 pm on 08/12/2023 by text page via the [facility] messaging system. IMPRESSION: 1. No evidence of acute  intracranial abnormality. ASPECTS of 10. 2. Moderate to severe chronic small vessel ischemic disease. Electronically Signed   By: Dasie Hamburg M.D.   On: 08/12/2023 16:06     Subjective: - no chest pain, shortness of breath, no abdominal pain, nausea or vomiting.   Discharge Exam: BP 126/74 (BP Location: Left Arm)   Pulse 66   Temp 98.7 F (37.1 C) (Oral)   Resp 18   Ht 5' 3 (1.6 m)   Wt 82.1 kg   SpO2 96%   BMI 32.06 kg/m   General: Pt is alert, awake, not in acute distress Cardiovascular: RRR, S1/S2 +, no rubs, no gallops Respiratory: CTA bilaterally, no wheezing, no rhonchi Abdominal: Soft, NT, ND, bowel sounds + Extremities: no edema, no cyanosis    The results of significant diagnostics from this hospitalization (including imaging, microbiology, ancillary and laboratory) are listed below for reference.     Microbiology: No results found for this or any previous visit (from the past 240 hours).   Labs: Basic Metabolic Panel: Recent Labs  Lab 08/12/23 1552 08/13/23 0350  NA 140 139  K 4.2 3.7  CL 104 105  CO2 26 24  GLUCOSE 99 89  BUN 13 10  CREATININE 0.88 0.79  CALCIUM  9.4 8.9   Liver Function Tests: Recent Labs  Lab 08/12/23 1552 08/13/23 0350  AST 22 17  ALT 10 10  ALKPHOS 72 56  BILITOT 0.5 0.7  PROT 7.1 6.2*  ALBUMIN 4.1 3.3*   CBC: Recent Labs  Lab 08/12/23 1552 08/13/23 0350  WBC 6.9 7.2  NEUTROABS 4.8  --   HGB 12.0 12.0  HCT 36.7 38.1  MCV 88.0 90.9  PLT 230 214   CBG: No results for input(s): GLUCAP in the last 168 hours. Hgb A1c Recent Labs    08/13/23 0350  HGBA1C 5.2   Lipid Profile Recent Labs    08/13/23 0350  CHOL 148  HDL 53  LDLCALC 81  TRIG 68  CHOLHDL 2.8   Thyroid  function studies No results for input(s): TSH, T4TOTAL, T3FREE, THYROIDAB in the last 72 hours.  Invalid input(s): FREET3 Urinalysis No results found for: COLORURINE, APPEARANCEUR, LABSPEC, PHURINE, GLUCOSEU,  HGBUR, BILIRUBINUR, KETONESUR, PROTEINUR, UROBILINOGEN, NITRITE, LEUKOCYTESUR  FURTHER DISCHARGE INSTRUCTIONS:   Get Medicines reviewed and adjusted: Please take all your medications with you for your next visit with your Primary MD   Laboratory/radiological data: Please request your Primary MD to go over all hospital tests and procedure/radiological results at the follow up, please ask your Primary MD to get all Hospital records sent to his/her office.   In some cases, they will be blood work, cultures and biopsy results pending at the time of your discharge. Please request that your primary care M.D. goes through all the records of your hospital data and follows up on these results.   Also Note the following: If you experience worsening of your admission symptoms, develop shortness of breath, life threatening emergency, suicidal or homicidal thoughts you must seek medical attention immediately by calling 911 or calling your MD immediately  if symptoms less severe.   You must read complete instructions/literature along with all the possible adverse reactions/side effects for all the Medicines you take and that have been prescribed to you. Take any new Medicines after you have completely understood and accpet all the possible  adverse reactions/side effects.    Do not drive when taking Pain medications or sleeping medications (Benzodaizepines)   Do not take more than prescribed Pain, Sleep and Anxiety Medications. It is not advisable to combine anxiety,sleep and pain medications without talking with your primary care practitioner   Special Instructions: If you have smoked or chewed Tobacco  in the last 2 yrs please stop smoking, stop any regular Alcohol  and or any Recreational drug use.   Wear Seat belts while driving.   Please note: You were cared for by a hospitalist during your hospital stay. Once you are discharged, your primary care physician will handle any further  medical issues. Please note that NO REFILLS for any discharge medications will be authorized once you are discharged, as it is imperative that you return to your primary care physician (or establish a relationship with a primary care physician if you do not have one) for your post hospital discharge needs so that they can reassess your need for medications and monitor your lab values.  Time coordinating discharge: 40 minutes  SIGNED:  Nilda Fendt, MD, PhD 08/13/2023, 5:52 PM

## 2023-08-13 NOTE — Plan of Care (Signed)
 Problem: Education: Goal: Knowledge of General Education information will improve Description: Including pain rating scale, medication(s)/side effects and non-pharmacologic comfort measures Outcome: Adequate for Discharge   Problem: Health Behavior/Discharge Planning: Goal: Ability to manage health-related needs will improve Outcome: Adequate for Discharge   Problem: Clinical Measurements: Goal: Ability to maintain clinical measurements within normal limits will improve Outcome: Adequate for Discharge Goal: Will remain free from infection Outcome: Adequate for Discharge Goal: Diagnostic test results will improve Outcome: Adequate for Discharge Goal: Respiratory complications will improve Outcome: Adequate for Discharge Goal: Cardiovascular complication will be avoided Outcome: Adequate for Discharge   Problem: Activity: Goal: Risk for activity intolerance will decrease Outcome: Adequate for Discharge   Problem: Nutrition: Goal: Adequate nutrition will be maintained Outcome: Adequate for Discharge   Problem: Coping: Goal: Level of anxiety will decrease Outcome: Adequate for Discharge   Problem: Elimination: Goal: Will not experience complications related to bowel motility Outcome: Adequate for Discharge Goal: Will not experience complications related to urinary retention Outcome: Adequate for Discharge   Problem: Pain Managment: Goal: General experience of comfort will improve and/or be controlled Outcome: Adequate for Discharge   Problem: Safety: Goal: Ability to remain free from injury will improve Outcome: Adequate for Discharge   Problem: Skin Integrity: Goal: Risk for impaired skin integrity will decrease Outcome: Adequate for Discharge   Problem: Education: Goal: Knowledge of disease or condition will improve Outcome: Adequate for Discharge Goal: Knowledge of secondary prevention will improve (MUST DOCUMENT ALL) Outcome: Adequate for Discharge Goal:  Knowledge of patient specific risk factors will improve (DELETE if not current risk factor) Outcome: Adequate for Discharge   Problem: Ischemic Stroke/TIA Tissue Perfusion: Goal: Complications of ischemic stroke/TIA will be minimized Outcome: Adequate for Discharge   Problem: Coping: Goal: Will verbalize positive feelings about self Outcome: Adequate for Discharge Goal: Will identify appropriate support needs Outcome: Adequate for Discharge   Problem: Health Behavior/Discharge Planning: Goal: Ability to manage health-related needs will improve Outcome: Adequate for Discharge Goal: Goals will be collaboratively established with patient/family Outcome: Adequate for Discharge   Problem: Self-Care: Goal: Ability to participate in self-care as condition permits will improve Outcome: Adequate for Discharge Goal: Verbalization of feelings and concerns over difficulty with self-care will improve Outcome: Adequate for Discharge Goal: Ability to communicate needs accurately will improve Outcome: Adequate for Discharge   Problem: Nutrition: Goal: Risk of aspiration will decrease Outcome: Adequate for Discharge Goal: Dietary intake will improve Outcome: Adequate for Discharge   Problem: Education: Goal: Knowledge of disease or condition will improve Outcome: Adequate for Discharge Goal: Knowledge of secondary prevention will improve (MUST DOCUMENT ALL) Outcome: Adequate for Discharge Goal: Knowledge of patient specific risk factors will improve (DELETE if not current risk factor) Outcome: Adequate for Discharge   Problem: Ischemic Stroke/TIA Tissue Perfusion: Goal: Complications of ischemic stroke/TIA will be minimized Outcome: Adequate for Discharge   Problem: Coping: Goal: Will verbalize positive feelings about self Outcome: Adequate for Discharge Goal: Will identify appropriate support needs Outcome: Adequate for Discharge   Problem: Health Behavior/Discharge  Planning: Goal: Ability to manage health-related needs will improve Outcome: Adequate for Discharge Goal: Goals will be collaboratively established with patient/family Outcome: Adequate for Discharge   Problem: Self-Care: Goal: Ability to participate in self-care as condition permits will improve Outcome: Adequate for Discharge Goal: Verbalization of feelings and concerns over difficulty with self-care will improve Outcome: Adequate for Discharge Goal: Ability to communicate needs accurately will improve Outcome: Adequate for Discharge   Problem: Nutrition: Goal: Risk of  aspiration will decrease Outcome: Adequate for Discharge Goal: Dietary intake will improve Outcome: Adequate for Discharge

## 2023-08-13 NOTE — TOC CM/SW Note (Addendum)
 Transition of Care [facility]) - Inpatient Brief Assessment   Patient Details  Name: SHARESA KEMP MRN: [id number] Date of Birth: 1945-02-10  Transition of [facility]) CM/SW Contact:    Waddell Barnie Rama, RN Phone Number: 08/13/2023, 1:30 PM   Clinical Narrative: From home (IDL) alone, has PCP  Katheryn Dimes new phone is [phone] and insurance on file, states has no HH services in place at this time , has walker /cane at home.  States Friend will transport them home at [employer] and family is support system, states gets medications from [facility] in [place].  Pta self ambulatory walker/cane.   Transition of Care Asessment: Insurance and Status: Insurance coverage has been reviewed Patient has primary care physician: Yes Home environment has been reviewed: home alone Prior level of function:: ambulatory with cane/walker Prior/Current Home Services: Current home services [employer]) Social Drivers of Health Review: SDOH reviewed no interventions necessary Readmission risk has been reviewed: Yes Transition of care needs: no transition of care needs at this time

## 2023-08-17 DIAGNOSIS — E538 Deficiency of other specified B group vitamins: Secondary | ICD-10-CM | POA: Diagnosis not present

## 2023-08-28 ENCOUNTER — Ambulatory Visit: Attending: Nurse Practitioner | Admitting: Nurse Practitioner

## 2023-08-28 ENCOUNTER — Encounter: Payer: Self-pay | Admitting: Nurse Practitioner

## 2023-08-28 VITALS — BP 132/80 | HR 97 | Ht 65.0 in | Wt 181.4 lb

## 2023-08-28 DIAGNOSIS — Z8249 Family history of ischemic heart disease and other diseases of the circulatory system: Secondary | ICD-10-CM | POA: Diagnosis not present

## 2023-08-28 DIAGNOSIS — E785 Hyperlipidemia, unspecified: Secondary | ICD-10-CM

## 2023-08-28 DIAGNOSIS — Z8673 Personal history of transient ischemic attack (TIA), and cerebral infarction without residual deficits: Secondary | ICD-10-CM | POA: Diagnosis not present

## 2023-08-28 DIAGNOSIS — I350 Nonrheumatic aortic (valve) stenosis: Secondary | ICD-10-CM

## 2023-08-28 MED ORDER — ATORVASTATIN CALCIUM 20 MG PO TABS: 20.0000 mg | ORAL_TABLET | Freq: Every day | ORAL | 3 refills | Status: AC

## 2023-08-28 NOTE — Patient Instructions (Signed)
 Medication Instructions:  Your physician recommends that you continue on your current medications as directed. Please refer to the Current Medication list given to you today.  *If you need a refill on your cardiac medications before your next appointment, please call your pharmacy*  Lab Work: Fasting lipid panel & LFTs 6-8 weeks  Testing/Procedures: Your physician has requested that you have an echocardiogram in 5-6 months . Echocardiography is a painless test that uses sound waves to create images of your heart. It provides your doctor with information about the size and shape of your heart and how well your heart's chambers and valves are working. This procedure takes approximately one hour. There are no restrictions for this procedure. Please do NOT wear cologne, perfume, aftershave, or lotions (deodorant is allowed). Please arrive 15 minutes prior to your appointment time.  Please note: We ask at that you not bring children with you during ultrasound (echo/ vascular) testing. Due to room size and safety concerns, children are not allowed in the ultrasound rooms during exams. Our front office staff cannot provide observation of children in our lobby area while testing is being conducted. An adult accompanying a patient to their appointment will only be allowed in the ultrasound room at the discretion of the ultrasound technician under special circumstances. We apologize for any inconvenience.    [employer] Cardiac Event Monitor Instructions  Your physician has requested you wear your cardiac event monitor for 30 days. [employer] may call or text to confirm a shipping address. The monitor will be sent to a land address via UPS. [employer] will not ship a monitor to a PO BOX. It typically takes 3-5 days to receive your monitor after it has been enrolled. [employer] will assist with USPS tracking if your package is delayed. The telephone number for [employer] is [phone]. Once you have  received your monitor, please review the enclosed instructions. Instruction tutorials can also be viewed under help and settings on the enclosed cell phone. Your monitor has already been registered assigning a specific monitor serial # to you.  Billing and Self Pay Discount Information  [employer] has been provided the insurance information we had on file for you.  If your insurance has been updated, please call [employer] at [phone] to provide them with your updated insurance information.   [employer] offers a discounted Self Pay option for patients who have insurance that does not cover their cardiac event monitor or patients without insurance.  The discounted cost of a Self Pay Cardiac Event Monitor would be $225.00 , if the patient contacts [employer] at [phone] within 7 days of applying the monitor to make payment arrangements.  If the patient does not contact [employer] within 7 days of applying the monitor, the cost of the cardiac event monitor will be $350.00.  Applying the monitor  Remove cell phone from case and turn it on. The cell phone works as IT consultant and needs to be within [employer] of you at all times. The cell phone will need to be charged on a daily basis. We recommend you plug the cell phone into the enclosed charger at your bedside table every night.  Monitor batteries: You will receive two monitor batteries labelled #1 and #2. These are your recorders. Plug battery #2 onto the second connection on the enclosed charger. Keep one battery on the charger at all times. This will keep the monitor battery deactivated. It will also keep it fully charged for when you need to switch your monitor batteries. A  small light will be blinking on the battery emblem when it is charging. The light on the battery emblem will remain on when the battery is fully charged.  Open package of a Monitor strip. Insert battery #1 into black hood on strip and gently squeeze monitor  battery onto connection as indicated in instruction booklet. Set aside while preparing skin.  Choose location for your strip, vertical or horizontal, as indicated in the instruction booklet. Shave to remove all hair from location. There cannot be any lotions, oils, powders, or colognes on skin where monitor is to be applied. Wipe skin clean with enclosed Saline wipe. Dry skin completely.  Peel paper labeled #1 off the back of the Monitor strip exposing the adhesive. Place the monitor on the chest in the vertical or horizontal position shown in the instruction booklet. One arrow on the monitor strip must be pointing upward. Carefully remove paper labeled #2, attaching remainder of strip to your skin. Try not to create any folds or wrinkles in the strip as you apply it.  Firmly press and release the circle in the center of the monitor battery. You will hear a small beep. This is turning the monitor battery on. The heart emblem on the monitor battery will light up every 5 seconds if the monitor battery in turned on and connected to the patient securely. Do not push and hold the circle down as this turns the monitor battery off. The cell phone will locate the monitor battery. A screen will appear on the cell phone checking the connection of your monitor strip. This may read poor connection initially but change to good connection within the next minute. Once your monitor accepts the connection you will hear a series of 3 beeps followed by a climbing crescendo of beeps. A screen will appear on the cell phone showing the two monitor strip placement options. Touch the picture that demonstrates where you applied the monitor strip.  Your monitor strip and battery are waterproof. You are able to shower, bathe, or swim with the monitor on. They just ask you do not submerge deeper than 3 feet underwater. We recommend removing the monitor if you are swimming in a lake, river, or ocean.  Your monitor  battery will need to be switched to a fully charged monitor battery approximately once a week. The cell phone will alert you of an action which needs to be made.  On the cell phone, tap for details to reveal connection status, monitor battery status, and cell phone battery status. The green dots indicates your monitor is in good status. A red dot indicates there is something that needs your attention.  To record a symptom, click the circle on the monitor battery. In 30-60 seconds a list of symptoms will appear on the cell phone. Select your symptom and tap save. Your monitor will record a sustained or significant arrhythmia regardless of you clicking the button. Some patients do not feel the heart rhythm irregularities. [employer] will notify us  of any serious or critical events.  Refer to instruction booklet for instructions on switching batteries, changing strips, the Do not disturb or Pause features, or any additional questions.  Call [employer] at [phone], to confirm your monitor is transmitting and record your baseline. They will answer any questions you may have regarding the monitor instructions at that time.  Returning the monitor to [employer]  Place all equipment back into blue box. Peel off strip of paper to expose adhesive and close box securely. There is  a prepaid UPS shipping label on this box. Drop in a UPS drop box, or at a UPS facility like [employer]. You may also contact [employer] to arrange UPS to pick up monitor package at your home.   Follow-Up: At [facility], you and your health needs are our priority.  As part of our continuing mission to provide you with exceptional heart care, our providers are all part of one team.  This team includes your primary Cardiologist (physician) and Advanced Practice Providers or APPs (Physician Assistants and Nurse Practitioners) who all work together to provide you with the care you need, when you need it.  Your next  appointment:    6 month f/u (must be after echo)  Provider:   Annabella Scarce, MD    We recommend signing up for the patient portal called MyChart.  Sign up information is provided on this After Visit Summary.  MyChart is used to connect with patients for Virtual Visits (Telemedicine).  Patients are able to view lab/test results, encounter notes, upcoming appointments, etc.  Non-urgent messages can be sent to your provider as well.   To learn more about what you can do with MyChart, go to [email or web address].   Other Instructions

## 2023-08-28 NOTE — Progress Notes (Signed)
 Office Visit    Patient Name: Julia Bowman Date of Encounter: 08/30/2023  Primary Care Provider:  Feliciano Devoria LABOR, MD Primary Cardiologist:  Annabella Scarce, MD  Chief Complaint    78 year old female with a history of aortic stenosis, hyperlipidemia, family history of CAD, and TIA/CVA who presents for [facility] new patient evaluation in the setting of aortic stenosis.  Past Medical History    Past Medical History:  Diagnosis Date   Dysrhythmia    Past Surgical History:  Procedure Laterality Date   ABDOMINAL HYSTERECTOMY     BREAST BIOPSY     COLONOSCOPY WITH PROPOFOL  N/A 01/17/2015   Procedure: COLONOSCOPY WITH PROPOFOL ;  Surgeon: Elsie Cree, MD;  Location: [facility];  Service: Endoscopy;  Laterality: N/A;   HAND SURGERY     STOMACH SURGERY     TONSILLECTOMY      Allergies  No Known Allergies   Labs/Other Studies Reviewed    The following studies were reviewed today:  Cardiac Studies & Procedures   ______________________________________________________________________________________________     ECHOCARDIOGRAM  ECHOCARDIOGRAM COMPLETE 08/13/2023  Narrative ECHOCARDIOGRAM REPORT    Patient Name:   Julia Bowman [facility] Date of Exam: 08/13/2023 Medical Rec #:  [id number]       Height:       63.0 in Accession #:    [id number]      Weight:       181.0 lb Date of Birth:  Jun 26, 1945      BSA:          1.853 m Patient Age:    77 years        BP:           150/77 mmHg Patient Gender: F               HR:           74 bpm. Exam Location:  Inpatient  Procedure: 2D Echo, Cardiac Doppler, Color Doppler, 3D Echo and Strain Analysis (Both Spectral and Color Flow Doppler were utilized during procedure).  Indications:    Stroke  History:        Patient has prior history of Echocardiogram examinations, most recent 04/20/2014.  Sonographer:    Therisa Crouch Referring Phys: [id number] ANASTASSIA DOUTOVA  IMPRESSIONS   1. Left ventricular ejection  fraction, by estimation, is 50 to 55%. Left ventricular ejection fraction by 3D volume is 52 %. The left ventricle has low normal function. The left ventricle demonstrates global hypokinesis. There is mild concentric left ventricular hypertrophy. Left ventricular diastolic function could not be evaluated. The average left ventricular global longitudinal strain is -16.1 %. The global longitudinal strain is abnormal. 2. Right ventricular systolic function is mildly reduced. The right ventricular size is normal. 3. The mitral valve is abnormal. No evidence of mitral valve regurgitation. No evidence of mitral stenosis. Severe mitral annular calcification. 4. The aortic valve is calcified. There is moderate calcification of the aortic valve. There is severe thickening of the aortic valve. Aortic valve regurgitation is mild to moderate. Severe aortic valve stenosis. Aortic valve area, by VTI measures 0.81 cm. Aortic valve mean gradient measures 39.7 mmHg. Aortic valve Vmax measures 4.25 m/s.  FINDINGS Left Ventricle: Left ventricular ejection fraction, by estimation, is 50 to 55%. Left ventricular ejection fraction by 3D volume is 52 %. The left ventricle has low normal function. The left ventricle demonstrates global hypokinesis. The average left ventricular global longitudinal strain is -16.1 %. Strain was performed and the  global longitudinal strain is abnormal. The left ventricular internal cavity size was normal in size. There is mild concentric left ventricular hypertrophy. Left ventricular diastolic function could not be evaluated due to mitral annular calcification (moderate or greater). Left ventricular diastolic function could not be evaluated.  Right Ventricle: The right ventricular size is normal. No increase in right ventricular wall thickness. Right ventricular systolic function is mildly reduced.  Left Atrium: Left atrial size was normal in size.  Right Atrium: Right atrial size was normal  in size.  Pericardium: There is no evidence of pericardial effusion.  Mitral Valve: The mitral valve is abnormal. Severe mitral annular calcification. No evidence of mitral valve regurgitation. No evidence of mitral valve stenosis. MV peak gradient, 7.9 mmHg. The mean mitral valve gradient is 3.0 mmHg.  Tricuspid Valve: The tricuspid valve is normal in structure. Tricuspid valve regurgitation is mild.  Aortic Valve: DVI 0.23, severe AS. The aortic valve is calcified. There is moderate calcification of the aortic valve. There is severe thickening of the aortic valve. Aortic valve regurgitation is mild to moderate. Severe aortic stenosis is present. Aortic valve mean gradient measures 39.7 mmHg. Aortic valve peak gradient measures 72.2 mmHg. Aortic valve area, by VTI measures 0.81 cm.  Pulmonic Valve: The pulmonic valve was normal in structure. Pulmonic valve regurgitation is not visualized.  Aorta: The aortic root and ascending aorta are structurally normal, with no evidence of dilitation.  IAS/Shunts: No atrial level shunt detected by color flow Doppler.  Additional Comments: 3D was performed not requiring image post processing on an independent workstation and was abnormal.   LEFT VENTRICLE PLAX 2D LVIDd:         4.70 cm         Diastology LVIDs:         3.00 cm         LV e' medial:    4.35 cm/s LV PW:         1.10 cm         LV E/e' medial:  16.0 LV IVS:        1.10 cm         LV e' lateral:   5.77 cm/s LVOT diam:     2.10 cm         LV E/e' lateral: 12.1 LV SV:         77 LV SV Index:   42              2D Longitudinal LVOT Area:     3.46 cm        Strain 2D Strain GLS   -16.1 % Avg:  3D Volume EF LV 3D EF:    Left ventricul ar ejection fraction by 3D volume is 52 %. LV 3D EDV:   99.25 ml LV 3D ESV:   47.63 ml  3D Volume EF: 3D EF:        52 %  RIGHT VENTRICLE            IVC RV Basal diam:  3.00 cm    IVC diam: 1.70 cm RV S prime:     8.92 cm/s TAPSE (M-mode):  1.8 cm  LEFT ATRIUM             Index LA diam:        4.00 cm 2.16 cm/m LA Vol (A2C):   65.3 ml 35.23 ml/m LA Vol (A4C):   62.7 ml 33.83 ml/m LA Biplane Vol: 63.8 ml 34.42 ml/m  AORTIC VALVE AV Area (Vmax):    0.76 cm AV Area (Vmean):   0.78 cm AV Area (VTI):     0.81 cm AV Vmax:           425.00 cm/s AV Vmean:          293.667 cm/s AV VTI:            0.956 m AV Peak Grad:      72.2 mmHg AV Mean Grad:      39.7 mmHg LVOT Vmax:         92.75 cm/s LVOT Vmean:        66.550 cm/s LVOT VTI:          0.224 m LVOT/AV VTI ratio: 0.23  AORTA Ao Root diam: 3.10 cm Ao Asc diam:  3.80 cm  MITRAL VALVE                TRICUSPID VALVE MV Area (PHT): 2.20 cm     TV Peak grad:   13.5 mmHg MV Area VTI:   1.93 cm     TV Vmax:        1.84 m/s MV Peak grad:  7.9 mmHg MV Mean grad:  3.0 mmHg     SHUNTS MV Vmax:       1.40 m/s     Systemic VTI:  0.22 m MV Vmean:      79.8 cm/s    Systemic Diam: 2.10 cm MV Decel Time: 345 msec MV E velocity: 69.70 cm/s MV A velocity: 132.00 cm/s MV E/A ratio:  0.53  Morene Brownie Electronically signed by Morene Brownie Signature Date/Time: 08/13/2023/4:44:56 PM    Final      CT SCANS  CT CARDIAC SCORING (SELF PAY ONLY) 11/22/2019  Narrative CLINICAL DATA:  Hyperlipidemia.  Family history of heart disease.  EXAM: CT CARDIAC CORONARY ARTERY CALCIUM  SCORE  TECHNIQUE: Non-contrast imaging through the heart was performed using prospective ECG gating. Image post processing was performed on an independent workstation, allowing for quantitative analysis of the heart and coronary arteries. Note that this exam targets the heart and the chest was not imaged in its entirety.  COMPARISON:  10/10/2009 chest CT.  FINDINGS: CORONARY CALCIUM  SCORES:  Left Main: 0  LAD: 95.7  LCx: 17.9  RCA: 0  Total Agatston Score: 114  MESA database percentile: 59th  AORTA MEASUREMENTS:  Ascending Aorta: 31 mm  Descending Aorta: 30 mm  OTHER  FINDINGS:  No pleural fluid. Nodule along the right minor fissure on 33/9 is similar to on the prior and can be seen presumed a benign subpleural lymph node.  A right lower lobe 5 mm nodule on 58/9 is also similar in 2011 and presumed benign.  Aortic atherosclerosis.  Tortuous thoracic aorta.  Normal heart size.  Aortic valve calcification.  Pulmonary artery enlargement, outflow tract 3.4 cm  No imaged thoracic adenopathy. Surgical changes at the gastroesophageal junction. Esophageal fluid level on 24/3.  Normal imaged portions of the liver, spleen.  Osteopenia with moderate thoracic spondylosis.  IMPRESSION: 1. Total Agatston score of 114, corresponding to 59th percentile for age, sex, and race based cohort. 2. Pulmonary artery enlargement suggests pulmonary arterial hypertension. 3. Esophageal air fluid level suggests dysmotility or gastroesophageal reflux. 4. Aortic Atherosclerosis (ICD10-I70.0). 5. Aortic valvular calcifications. Consider echocardiography to evaluate for valvular dysfunction.   Electronically Signed By: Rockey Kilts M.D. On: 11/22/2019 14:00     ______________________________________________________________________________________________     Recent Labs: 08/13/2023: ALT 10; BUN 10;  Creatinine, Ser 0.79; Hemoglobin 12.0; Platelets 214; Potassium 3.7; Sodium 139  Recent Lipid Panel    Component Value Date/Time   CHOL 148 08/13/2023 0350   TRIG 68 08/13/2023 0350   HDL 53 08/13/2023 0350   CHOLHDL 2.8 08/13/2023 0350   VLDL 14 08/13/2023 0350   LDLCALC 81 08/13/2023 0350    History of Present Illness    78 year old female with the above past medical history including aortic stenosis, hyperlipidemia, family history of CAD, and TIA/CVA.  She has a history of aortic stenosis, previously noted to be moderate. Echocardiogram in 2016 showed EF 60 to 65%, normal LV function, no RWMA, G1 DD, moderate aortic stenosis, mean gradient 18 mmHg. Coronary  calcium  score in 2021 was 114 (59 percentile), there was evidence of aortic atherosclerosis, aortic valvular calcifications.  She was hospitalized from 08/12/2023 to 08/13/2023 in the setting of acute TIA/CVA. MRI of the brain showed possible punctuate acute infarct of the left postcentral gyrus versus artifact.  CT angiogram head and neck was without any large vessel occlusions. Echocardiogram showed EF 50 to 55%, low normal LV function, LV global hypokinesis, mild concentric LVH, mildly reduced RV systolic function, mild to moderate aortic valve regurgitation, severe aortic stenosis, mean gradient 39.7 mmHg. She was advised to follow-up with cardiology.  She presents today to establish care, accompanied by her friend.  Since her hospitalization she has done well. She reports a family history of heart disease, her father died of a heart attack at the age of 57.  Her her uncle also died of a heart attack at a young age. She has stable dependent nonpitting bilateral lower extreme edema, she reports this has been present since the birth of her son in 68. She denies any chest pain, palpitations, dizziness, presyncope, syncope, PND, orthopnea, weight gain.  Overall, she reports feeling well.    Home Medications    Current Outpatient Medications  Medication Sig Dispense Refill   aspirin  EC 81 MG tablet Take 1 tablet (81 mg total) by mouth daily. Swallow whole. 90 tablet 0   clopidogrel  (PLAVIX ) 75 MG tablet Take 1 tablet (75 mg total) by mouth daily for 21 days. 21 tablet 0   cyanocobalamin  (,VITAMIN B-12,) 1000 MCG/ML injection Inject 1,000 mcg into the skin every 30 (thirty) days.   1   fluticasone  (FLONASE ) 50 MCG/ACT nasal spray Place 2 sprays into both nostrils daily. 2 sprays each nostril at night 16 g 6   hydroxypropyl methylcellulose / hypromellose (ISOPTO TEARS / GONIOVISC) 2.5 % ophthalmic solution Place 1 drop into both eyes 4 (four) times daily as needed for dry eyes.     Multiple Vitamin  (MULTIVITAMIN WITH MINERALS) TABS tablet Take 1 tablet by mouth daily.     triamcinolone  (NASACORT ) 55 MCG/ACT AERO nasal inhaler Place 2 sprays into the nose daily. 2 sprays each nostril at night 1 each 12   atorvastatin  (LIPITOR) 20 MG tablet Take 1 tablet (20 mg total) by mouth daily. 90 tablet 3   No current facility-administered medications for this visit.     Review of Systems    She denies chest pain, palpitations, dyspnea, pnd, orthopnea, n, v, dizziness, syncope, edema, weight gain, or early satiety. All other systems reviewed and are otherwise negative except as noted above.   Physical Exam    VS:  BP 132/80 (BP Location: Right Arm, Patient Position: Sitting, Cuff Size: Normal)   Pulse 97   Ht 5' 5 (1.651 m)   Wt 181 lb  6.4 oz (82.3 kg)   SpO2 97%   BMI 30.19 kg/m   GEN: Well nourished, well developed, in no acute distress. HEENT: normal. Neck: Supple, no JVD, carotid bruits, or masses. Cardiac: RRR, 2/6 murmur, no rubs, or gallops. No clubbing, cyanosis, edema.  Radials/DP/PT 2+ and equal bilaterally.  Respiratory:  Respirations regular and unlabored, clear to auscultation bilaterally. GI: Soft, nontender, nondistended, BS + x 4. MS: no deformity or atrophy. Skin: warm and dry, no rash. Neuro:  Strength and sensation are intact. Psych: Normal affect.  Accessory Clinical Findings    ECG personally reviewed by me today -    - no EKG in office today.   Lab Results  Component Value Date   WBC 7.2 08/13/2023   HGB 12.0 08/13/2023   HCT 38.1 08/13/2023   MCV 90.9 08/13/2023   PLT 214 08/13/2023   Lab Results  Component Value Date   CREATININE 0.79 08/13/2023   BUN 10 08/13/2023   NA 139 08/13/2023   K 3.7 08/13/2023   CL 105 08/13/2023   CO2 24 08/13/2023   Lab Results  Component Value Date   ALT 10 08/13/2023   AST 17 08/13/2023   ALKPHOS 56 08/13/2023   BILITOT 0.7 08/13/2023   Lab Results  Component Value Date   CHOL 148 08/13/2023   HDL 53  08/13/2023   LDLCALC 81 08/13/2023   TRIG 68 08/13/2023   CHOLHDL 2.8 08/13/2023    Lab Results  Component Value Date   HGBA1C 5.2 08/13/2023    Assessment & Plan    1. Aortic stenosis: Previously noted to be moderate. Most recent  echocardiogram in 07/2023 showed EF 50 to 55%, low normal LV function, LV global hypokinesis, mild concentric LVH, mildly reduced RV systolic function, mild to moderate aortic valve regurgitation, severe aortic stenosis, mean gradient 39.7 mmHg. She is completely asymptomatic, euvolemic and well compensated on exam. We did discuss the progressive nature of aortic stenosis, likely need for surgery in the future. Through shared decision making, will repeat echocardiogram in 5 to 6 months for monitoring of aortic stenosis.  Reviewed warning symptoms.  2. Hyperlipidemia: LDL was 81 in 07/2023.  She was started on Lipitor.  Will check fasting lipids, LFTs in 6 to 8 weeks.    3. Family history of CAD: Stable with no anginal symptoms. No indication for ischemic evaluation.   4. History of TIA/CVA: Recent hospitalization in the setting of acute CVA.  MRI of the brain showed possible punctuate acute infarct of the left postcentral gyrus versus artifact. CT angiogram head and neck was without any large vessel occlusions. Echocardiogram as above. Will check 30-day event monitor in the setting of stroke.   5. Disposition: She is requesting to establish with Dr. Raford.  She will follow-up with Dr. Raford after echo in 6 months.  Damien JAYSON Braver, NP 08/30/2023, 4:31 PM

## 2023-08-29 DIAGNOSIS — Z7982 Long term (current) use of aspirin: Secondary | ICD-10-CM | POA: Diagnosis not present

## 2023-08-29 DIAGNOSIS — Z7902 Long term (current) use of antithrombotics/antiplatelets: Secondary | ICD-10-CM | POA: Diagnosis not present

## 2023-08-29 DIAGNOSIS — H9191 Unspecified hearing loss, right ear: Secondary | ICD-10-CM | POA: Diagnosis not present

## 2023-08-29 DIAGNOSIS — Z8249 Family history of ischemic heart disease and other diseases of the circulatory system: Secondary | ICD-10-CM | POA: Diagnosis not present

## 2023-08-29 DIAGNOSIS — M1990 Unspecified osteoarthritis, unspecified site: Secondary | ICD-10-CM | POA: Diagnosis not present

## 2023-08-29 DIAGNOSIS — M810 Age-related osteoporosis without current pathological fracture: Secondary | ICD-10-CM | POA: Diagnosis not present

## 2023-08-29 DIAGNOSIS — R030 Elevated blood-pressure reading, without diagnosis of hypertension: Secondary | ICD-10-CM | POA: Diagnosis not present

## 2023-08-29 DIAGNOSIS — Z8673 Personal history of transient ischemic attack (TIA), and cerebral infarction without residual deficits: Secondary | ICD-10-CM | POA: Diagnosis not present

## 2023-08-29 DIAGNOSIS — R011 Cardiac murmur, unspecified: Secondary | ICD-10-CM | POA: Diagnosis not present

## 2023-08-29 DIAGNOSIS — Z809 Family history of malignant neoplasm, unspecified: Secondary | ICD-10-CM | POA: Diagnosis not present

## 2023-08-29 DIAGNOSIS — E785 Hyperlipidemia, unspecified: Secondary | ICD-10-CM | POA: Diagnosis not present

## 2023-08-29 DIAGNOSIS — D519 Vitamin B12 deficiency anemia, unspecified: Secondary | ICD-10-CM | POA: Diagnosis not present

## 2023-08-30 ENCOUNTER — Encounter: Payer: Self-pay | Admitting: Nurse Practitioner

## 2023-08-31 ENCOUNTER — Other Ambulatory Visit: Payer: Self-pay | Admitting: Nurse Practitioner

## 2023-08-31 DIAGNOSIS — G459 Transient cerebral ischemic attack, unspecified: Secondary | ICD-10-CM

## 2023-08-31 DIAGNOSIS — E7800 Pure hypercholesterolemia, unspecified: Secondary | ICD-10-CM | POA: Diagnosis not present

## 2023-08-31 DIAGNOSIS — Z8673 Personal history of transient ischemic attack (TIA), and cerebral infarction without residual deficits: Secondary | ICD-10-CM

## 2023-08-31 DIAGNOSIS — E785 Hyperlipidemia, unspecified: Secondary | ICD-10-CM

## 2023-08-31 DIAGNOSIS — N182 Chronic kidney disease, stage 2 (mild): Secondary | ICD-10-CM | POA: Diagnosis not present

## 2023-08-31 DIAGNOSIS — Z8249 Family history of ischemic heart disease and other diseases of the circulatory system: Secondary | ICD-10-CM

## 2023-08-31 DIAGNOSIS — I350 Nonrheumatic aortic (valve) stenosis: Secondary | ICD-10-CM

## 2023-08-31 DIAGNOSIS — I639 Cerebral infarction, unspecified: Secondary | ICD-10-CM

## 2023-08-31 DIAGNOSIS — E538 Deficiency of other specified B group vitamins: Secondary | ICD-10-CM | POA: Diagnosis not present

## 2023-08-31 DIAGNOSIS — R42 Dizziness and giddiness: Secondary | ICD-10-CM

## 2023-09-03 DIAGNOSIS — Z8673 Personal history of transient ischemic attack (TIA), and cerebral infarction without residual deficits: Secondary | ICD-10-CM | POA: Diagnosis not present

## 2023-09-03 DIAGNOSIS — I872 Venous insufficiency (chronic) (peripheral): Secondary | ICD-10-CM | POA: Diagnosis not present

## 2023-09-03 DIAGNOSIS — I350 Nonrheumatic aortic (valve) stenosis: Secondary | ICD-10-CM | POA: Diagnosis not present

## 2023-09-03 DIAGNOSIS — Z7982 Long term (current) use of aspirin: Secondary | ICD-10-CM | POA: Diagnosis not present

## 2023-09-03 DIAGNOSIS — I779 Disorder of arteries and arterioles, unspecified: Secondary | ICD-10-CM | POA: Diagnosis not present

## 2023-09-03 DIAGNOSIS — R4189 Other symptoms and signs involving cognitive functions and awareness: Secondary | ICD-10-CM | POA: Diagnosis not present

## 2023-09-03 DIAGNOSIS — Z9189 Other specified personal risk factors, not elsewhere classified: Secondary | ICD-10-CM | POA: Diagnosis not present

## 2023-09-04 DIAGNOSIS — R42 Dizziness and giddiness: Secondary | ICD-10-CM | POA: Diagnosis not present

## 2023-09-04 DIAGNOSIS — G459 Transient cerebral ischemic attack, unspecified: Secondary | ICD-10-CM | POA: Diagnosis not present

## 2023-09-18 DIAGNOSIS — E538 Deficiency of other specified B group vitamins: Secondary | ICD-10-CM | POA: Diagnosis not present

## 2023-09-21 ENCOUNTER — Telehealth: Payer: Self-pay | Admitting: Nurse Practitioner

## 2023-09-21 NOTE — Telephone Encounter (Signed)
 Spoke with [name]. Pt wore her monitor for about 2 weeks and removed it. Pt was asked to wear the monitor for 30 days.I explained to [name] that she can mail the monitor back and our office will receive the report for the time the monitor was worn. Please advise?

## 2023-09-21 NOTE — Telephone Encounter (Signed)
 This pt's friend, [name] Fuse, is inquiring about the heart monitor that this pt wore only for 2 wks. Says the patient took it off 2w ago & is concerned about what to do with the monitor.   Please contact [name] at: [phone].  JB, 09-21-23

## 2023-09-22 NOTE — Telephone Encounter (Signed)
 Spoke with [name]. She is aware that she will mail the used monitor via [employer] store. We will call to notify pt and [name] when results are available.

## 2023-09-30 ENCOUNTER — Ambulatory Visit: Admitting: Nurse Practitioner

## 2023-10-01 ENCOUNTER — Encounter (HOSPITAL_BASED_OUTPATIENT_CLINIC_OR_DEPARTMENT_OTHER): Payer: Self-pay | Admitting: Cardiovascular Disease

## 2023-10-06 ENCOUNTER — Ambulatory Visit: Attending: Nurse Practitioner

## 2023-10-06 DIAGNOSIS — G459 Transient cerebral ischemic attack, unspecified: Secondary | ICD-10-CM

## 2023-10-06 DIAGNOSIS — I639 Cerebral infarction, unspecified: Secondary | ICD-10-CM

## 2023-10-06 DIAGNOSIS — R42 Dizziness and giddiness: Secondary | ICD-10-CM

## 2023-10-13 DIAGNOSIS — R41841 Cognitive communication deficit: Secondary | ICD-10-CM | POA: Diagnosis not present

## 2023-10-13 DIAGNOSIS — R4189 Other symptoms and signs involving cognitive functions and awareness: Secondary | ICD-10-CM | POA: Diagnosis not present

## 2023-10-13 LAB — LIPID PANEL

## 2023-10-15 LAB — LIPID PANEL
Cholesterol, Total: 133 mg/dL (ref 100–199)
HDL: 51 mg/dL (ref >39)
LDL CALC COMMENT:: 2.6 ratio (ref 0.0–4.4)
LDL Chol Calc (NIH): 68 mg/dL (ref 0–99)
Triglycerides: 69 mg/dL (ref 0–149)
VLDL Cholesterol Cal: 14 mg/dL (ref 5–40)

## 2023-10-15 LAB — HEPATIC FUNCTION PANEL
ALT: 15 IU/L (ref 0–32)
AST: 23 IU/L (ref 0–40)
Albumin: 4 g/dL (ref 3.8–4.8)
Alkaline Phosphatase: 94 IU/L (ref 49–135)
Bilirubin Total: 0.5 mg/dL (ref 0.0–1.2)
Bilirubin, Direct: 0.21 mg/dL (ref 0.00–0.40)
Total Protein: 6.4 g/dL (ref 6.0–8.5)

## 2023-10-16 ENCOUNTER — Ambulatory Visit: Payer: Self-pay | Admitting: Nurse Practitioner

## 2023-10-20 DIAGNOSIS — R4189 Other symptoms and signs involving cognitive functions and awareness: Secondary | ICD-10-CM | POA: Diagnosis not present

## 2023-10-23 DIAGNOSIS — G459 Transient cerebral ischemic attack, unspecified: Secondary | ICD-10-CM | POA: Diagnosis not present

## 2023-10-23 DIAGNOSIS — R41841 Cognitive communication deficit: Secondary | ICD-10-CM | POA: Diagnosis not present

## 2023-10-23 DIAGNOSIS — R42 Dizziness and giddiness: Secondary | ICD-10-CM | POA: Diagnosis not present

## 2023-10-23 DIAGNOSIS — I639 Cerebral infarction, unspecified: Secondary | ICD-10-CM

## 2023-10-23 DIAGNOSIS — R4189 Other symptoms and signs involving cognitive functions and awareness: Secondary | ICD-10-CM | POA: Diagnosis not present

## 2023-10-27 ENCOUNTER — Telehealth: Payer: Self-pay | Admitting: Cardiovascular Disease

## 2023-10-27 NOTE — Telephone Encounter (Signed)
 Patient came in and left drivers license in [place]. Left voicemail to inform patient drivers license would be on the 1st floor (in the safe).

## 2023-10-28 DIAGNOSIS — R41841 Cognitive communication deficit: Secondary | ICD-10-CM | POA: Diagnosis not present

## 2023-10-28 DIAGNOSIS — R4189 Other symptoms and signs involving cognitive functions and awareness: Secondary | ICD-10-CM | POA: Diagnosis not present

## 2023-10-30 ENCOUNTER — Ambulatory Visit: Payer: Self-pay | Admitting: Nurse Practitioner

## 2023-11-02 DIAGNOSIS — E538 Deficiency of other specified B group vitamins: Secondary | ICD-10-CM | POA: Diagnosis not present

## 2023-11-03 DIAGNOSIS — R41841 Cognitive communication deficit: Secondary | ICD-10-CM | POA: Diagnosis not present

## 2023-11-03 DIAGNOSIS — R4189 Other symptoms and signs involving cognitive functions and awareness: Secondary | ICD-10-CM | POA: Diagnosis not present

## 2023-11-06 ENCOUNTER — Other Ambulatory Visit (HOSPITAL_COMMUNITY): Payer: Self-pay

## 2023-11-06 DIAGNOSIS — R41841 Cognitive communication deficit: Secondary | ICD-10-CM | POA: Diagnosis not present

## 2023-11-06 DIAGNOSIS — R4189 Other symptoms and signs involving cognitive functions and awareness: Secondary | ICD-10-CM | POA: Diagnosis not present

## 2023-11-06 DIAGNOSIS — E538 Deficiency of other specified B group vitamins: Secondary | ICD-10-CM | POA: Diagnosis not present

## 2023-11-09 DIAGNOSIS — R4189 Other symptoms and signs involving cognitive functions and awareness: Secondary | ICD-10-CM | POA: Diagnosis not present

## 2023-11-09 DIAGNOSIS — R41841 Cognitive communication deficit: Secondary | ICD-10-CM | POA: Diagnosis not present

## 2023-11-13 DIAGNOSIS — R4189 Other symptoms and signs involving cognitive functions and awareness: Secondary | ICD-10-CM | POA: Diagnosis not present

## 2023-11-13 DIAGNOSIS — R41841 Cognitive communication deficit: Secondary | ICD-10-CM | POA: Diagnosis not present

## 2023-11-16 DIAGNOSIS — R41841 Cognitive communication deficit: Secondary | ICD-10-CM | POA: Diagnosis not present

## 2023-11-16 DIAGNOSIS — R4189 Other symptoms and signs involving cognitive functions and awareness: Secondary | ICD-10-CM | POA: Diagnosis not present

## 2023-11-20 DIAGNOSIS — R41841 Cognitive communication deficit: Secondary | ICD-10-CM | POA: Diagnosis not present

## 2023-11-20 DIAGNOSIS — R4189 Other symptoms and signs involving cognitive functions and awareness: Secondary | ICD-10-CM | POA: Diagnosis not present

## 2023-11-23 DIAGNOSIS — R4189 Other symptoms and signs involving cognitive functions and awareness: Secondary | ICD-10-CM | POA: Diagnosis not present

## 2023-11-23 DIAGNOSIS — R41841 Cognitive communication deficit: Secondary | ICD-10-CM | POA: Diagnosis not present

## 2023-11-27 DIAGNOSIS — R41841 Cognitive communication deficit: Secondary | ICD-10-CM | POA: Diagnosis not present

## 2023-11-27 DIAGNOSIS — R4189 Other symptoms and signs involving cognitive functions and awareness: Secondary | ICD-10-CM | POA: Diagnosis not present

## 2023-11-30 DIAGNOSIS — R413 Other amnesia: Secondary | ICD-10-CM | POA: Diagnosis not present

## 2023-11-30 DIAGNOSIS — R4189 Other symptoms and signs involving cognitive functions and awareness: Secondary | ICD-10-CM | POA: Diagnosis not present

## 2023-11-30 DIAGNOSIS — R41841 Cognitive communication deficit: Secondary | ICD-10-CM | POA: Diagnosis not present

## 2023-11-30 NOTE — Progress Notes (Signed)
 New Patient Note 11/30/2023  Reason for Visit  Consult/ **$30/ccw** In person: spoke with [name] (friend) Referred by :L. Zackary, PA-C Dx:Cog decline/TIA notes in chart: initals/ date: [facility] 08-24-23 ndt  Assessment  1) TIA.  July 2025.  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  Stroke modifiable risk factors include: HTN, dyslipidemia.  Recommend target BP <140/90, LDL <70 mg/dL.  We reviewed pathophysiology, diagnostic considerations, treatment concepts and options as well as prognosis.  Recommended indefinite use of ECASA 81 mg daily.  May require prolonged cardiac rhythm monitoring to rule out emboligenic arrhythmia.  Encouraged continued surveillance of aortic stenosis with cardiology service.  Urged to seek immediate medical attention with any new focal neurological symptoms which were reviewed with the patient.  All posed questions answered.  2) Memory impairment.  Suggestive for mild cognitive impairment (MCI).  MoCA score 11/30/2023 was 25/30. We reviewed diagnostic considerations, treatment concepts and limitations as well as prognosis.  Will arrange for additional screening memory labs.  Encouraged continued efforts at salubrious cognitive and physical activities.  May wish to consider introduction of donepezil thereafter. All posed questions were answered.  Plan  1) MoCA test today: 25/30. 2) Additional memory screening labs. 3) Encouraged optimization of cerebrovascular risk factors with PCP including target BP <140/90, LDL <70 mg/dL.  I plan to see Julia Bowman back for Return in about 4 months (around 03/29/2024).  She understands to call me for any questions or concerns.  _____________ L. Charlyne, MD [facility] - Neurology  [address]  [place] [address] [place]  [address] [phone]; fax [phone]  HPI  Julia Bowman is a 78 y.o. WF who presents today for further evaluation and management of recent TIA, memory impairment.  She is  accompanied by a close female friend today who supplements her history.  Patient reports she was in her usual state of health over the summer, had been to church, was preparing to leave by car, could not see, and driven by friend to ER where she was evaluated and admitted for presumptive TIA with symptoms having subsided within 2 hours but she cannot recall exactly how long the symptoms lasted for  Denies any residual focal neurological changes No new focal neurological changes No new language or speech changes No new diplopia or amaurosis fugax No new strength or dexterity changes No new numbness or tingling No new balance changes or fall  Sleep: OK Parasomnias: denied Hallucinations/delusions: denied Appetite: denied Dietary restrictions: denied Changes in smell/taste: denied Insight: Adequate Short-term memory worsening: Denied, some progression reported by friend who accompanies her today Misplacing items: no changes Conversational amnesia: some  Word finding difficulty: denied Behavioral changes: slightly down at times, possibly from boredom ADL assist:  denied Medication adherence: self directed, using weekly pill minder  Labs reviewed - Lipid profile 08/13/2023: Total cholesterol 148, LDL 81 mg/dL. Hemoglobin A1c 08/13/2023: 5.2%. TSH 10/22/2022: Normal. Hypercoagulable profile: N/A.  Last MRI brain: 08/13/2023.  Remote right frontal and right occipital infarcts.  Chronic microvascular ischemic changes.  Questioned acute infarct left postcentral gyrus versus infarct.  Last intracranial vascular imaging:  Last carotid Dopplers:  Last cardiac rhythm monitoring:  Last cardiac echo:  Reviewed recent hospitalization record from 7/16-17/25 at [facility].  Patient presented with ataxia and disequilibrium begun around 11 AM on day of presentation prompting stroke evaluation.  Patient with history of aortic stenosis, dyslipidemia.  Symptoms resolved after 2 hours.  Patient  admitted for TIA evaluation.  Punctate acute infarct in the left postcentral gyrus versus artifact noted.  LDL 81 mg/dL.  Hemoglobin A1c 5.2%.  Ejection fraction on echo was 50-55% with mild concentric LVH.  Severe aortic stenosis.  Recommended DAPT for 21 days per neurology.  Scheduled to undergo cardiac monitoring for her aortic stenosis.  Patient placed on statin.  Lipitor 20 mg daily.  Neurology felt MRI findings were artifactual.  Noted to have remote right frontal and right occipital infarcts.  Mild dysphagia noted.  Concern for posterior circulation TIA.  Exam  Blood pressure 131/88, pulse 106, height 1.651 m (5' 5), weight 82.4 kg (181 lb 9.6 oz), SpO2 94%.  Elder WF in NAD. Afebrile. Cooperative with examiner. [place]/AT. Carotids without bruits. Anicteric. Lids normal. RRR.  5/6 SEM at RUSB, 3/6 at LLSB. Full radial pulse. No audible wheeze. CTA at bases. No accessory respiratory muscle use. No rash in arms. No ecchymoses of arms/hands. Cervical spinous process nontender to palpation. Lumbar region palpation nontender.   Language intact. Affect full. Mood euthymic. VFF. EOMI. PER.  LT sense symmetric in face. Face symmetric. Phonation intact. Soft palate elevation symmetric.  Symmetric shoulder shrug.  Tongue symmetric without atrophy.  Mild right pronation but no drift. No tremor seen.  Strength symmetric and full in upper and lower extremities. DTRs symmetric at UEs and LEs. RAM intact. FNF intact.  Gait cautious. Sensory exam symmetrically intact to UE proprioception; to LT in UEs.  Vibratory intact in all 4 distal extremities.  Cold symmetric at calves.  PM/SH  Allergies[1] Current Rx[2] Medical History[3] Surgical History[4] Family History[5]  DATA   Lab Requisition on 08/31/2023  Component Date Value Ref Range Status  . WBC 08/31/2023 5.00  4.40 - 11.00 10*3/uL Final  . RBC 08/31/2023 4.15  4.10 - 5.10 10*6/uL Final  . Hemoglobin 08/31/2023 11.8 (L)  12.3 - 15.3 g/dL  Final  . Hematocrit 91/95/7974 35.7 (L)  35.9 - 44.6 % Final  . Mean Corpuscular Volume (MCV) 08/31/2023 86.1  80.0 - 96.0 fL Final  . Mean Corpuscular Hemoglobin (MCH) 08/31/2023 28.5  27.5 - 33.2 pg Final  . Mean Corpuscular Hemoglobin Conc (* 08/31/2023 33.1  33.0 - 37.0 g/dL Final  . Red Cell Distribution Width (RDW) 08/31/2023 14.6  12.3 - 17.0 % Final  . Platelet Count (PLT) 08/31/2023 230  150 - 450 10*3/uL Final  . Mean Platelet Volume (MPV) 08/31/2023 8.2  6.8 - 10.2 fL Final  . Neutrophils % 08/31/2023 63  % Final  . Lymphocytes % 08/31/2023 28  % Final  . Monocytes % 08/31/2023 7  % Final  . Eosinophils % 08/31/2023 1  % Final  . Basophils % 08/31/2023 1  % Final  . nRBC % 08/31/2023 0  % Final  . Neutrophils Absolute 08/31/2023 3.10  1.80 - 7.80 10*3/uL Final  . Lymphocytes # 08/31/2023 1.40  1.00 - 4.80 10*3/uL Final  . Monocytes # 08/31/2023 0.30  0.00 - 0.80 10*3/uL Final  . Eosinophils # 08/31/2023 0.10  0.00 - 0.50 10*3/uL Final  . Basophils # 08/31/2023 0.10  0.00 - 0.20 10*3/uL Final  . nRBC Absolute 08/31/2023 0.00  <=0.00 10*3/uL Final  . Sodium 08/31/2023 143  136 - 145 mmol/L Final  . Potassium 08/31/2023 4.1  3.5 - 5.1 mmol/L Final   NO VISIBLE HEMOLYSIS  . Chloride 08/31/2023 107  98 - 107 mmol/L Final  . CO2 08/31/2023 23  21 - 31 mmol/L Final  . Anion Gap 08/31/2023 13  6 -  14 mmol/L Final  . Glucose, Random 08/31/2023 91  70 - 99 mg/dL Final  . Blood Urea Nitrogen (BUN) 08/31/2023 13  7 - 25 mg/dL Final  . Creatinine 91/95/7974 0.71  0.60 - 1.20 mg/dL Final  . eGFR 91/95/7974 87  >59 mL/min/1.13m2 Final   GFR estimated by CKD-EPI equations(NKF 2021).   Recommend confirmation of Cr-based eGFR by using Cys-based eGFR and other filtration markers (if applicable) in complex cases and clinical decision-making, as needed.  . Albumin 08/31/2023 4.1  3.5 - 5.7 g/dL Final  . Total Protein 08/31/2023 6.6  6.4 - 8.9 g/dL Final  . Bilirubin, Total 08/31/2023 0.6   0.3 - 1.0 mg/dL Final  . Alkaline Phosphatase (ALP) 08/31/2023 64  34 - 104 U/L Final  . Aspartate Aminotransferase (AST) 08/31/2023 18  13 - 39 U/L Final  . Alanine Aminotransferase (ALT) 08/31/2023 10  7 - 52 U/L Final  . Calcium  08/31/2023 9.1  8.6 - 10.3 mg/dL Final  . BUN/Creatinine Ratio 08/31/2023    Final   Creatinine is normal, ratio is not clinically indicated.   . Cholesterol, Total, Lipid Panel 08/31/2023 132  <200 mg/dL Final   NCEP III Guidelines   Total Cholesterol             Risk Classification                           <200 mg/dL                      Desirable    200-239 mg/dL                   Borderline High    >240 mg/dL                      High  . Triglycerides, Lipid Panel 08/31/2023 93  <150 mg/dL Final   NCEP-ATP III Guidelines    Triglyceride Value           Risk Classification                           <150 mg/dL                   Normal    150-199 mg/dL                Borderline High    200-499 mg/dL                High    >=500 mg/dL                  Very High   . HDL Cholesterol - Lipid Panel 08/31/2023 56 (L)  >=60 mg/dL Final   NCEP III Guidelines    HDLc                     Risk Classification                           >=60 mg/dL                  Optimal    >=40 mg/dL                  Desirable    <40 mg/dL  Low      . LDL Cholesterol, Calculated 08/31/2023 58  <100 mg/dL Final   LDL Cholesterol is calculated using the Martin/Hopkins equation. Source:  JAMA 2013; 310:  2061-68. NCEP-ATP III Guidelines    LDLc                     Risk Classification                           <100 mg/dL                   Optimal    100-129 mg/dL                Desirable    130-159 mg/dL                Borderline High    160-189 mg/dL                High    >190 mg/dL                   Very High   . Non-HDL Cholesterol 08/31/2023 76  mg/dL Final  . TSH 91/95/7974 2.435  0.450 - 5.330 uIU/mL Final  . Vitamin D 25-Hydroxy 08/31/2023 33.1   30.0 - 100.0 ng/mL Final   Deficient: <20 ng/mL  Insufficient: 20-30 ng/mL  Sufficient: 30-100 ng/mL  Upper Safety Limit/Toxicity: >100 ng/mL    Lab Results  Component Value Date   WBC 5.00 08/31/2023   HGB 11.8 (L) 08/31/2023   HCT 35.7 (L) 08/31/2023   PLT 230 08/31/2023   CHOL 132 08/31/2023   TRIG 93 08/31/2023   HDL 56 (L) 08/31/2023   ALT 10 08/31/2023   AST 18 08/31/2023   NA 143 08/31/2023   K 4.1 08/31/2023   CL 107 08/31/2023   CREATININE 0.71 08/31/2023   BUN 13 08/31/2023   CO2 23 08/31/2023   TSH 2.435 08/31/2023   HGBA1C 5.5 04/29/2023    IMAGING DATA  No image results found.   ____________________  MDM Professional time spent on direct face-to-face patient evaluation, care and coordination of care including indirect time spent on review of applicable labs, imaging, outside records, note composition on same day of visit: 60 minutes.   Orders Orders Placed This Encounter  Procedures  . Ammonia    Standing Status:   Future    Expected Date:   11/30/2023    Expiration Date:   11/29/2024    Release to patient::   Immediate  . ATN Profile    Standing Status:   Future    Expected Date:   11/30/2023    Expiration Date:   11/29/2024    Release to patient::   Delay Release    Reason for preventing immediate release::   Reasonable likelihood of causing physical harm  . Cortisol    Standing Status:   Future    Expected Date:   11/30/2023    Expiration Date:   11/29/2024    Release to patient::   Immediate  . Vitamin B12    Standing Status:   Future    Expected Date:   11/30/2023    Expiration Date:   11/29/2024    Release to patient::   Immediate  . Rapid Plasma Reagin (RPR), Qualitative Test with Reflex to Titer and Confirmation    Standing Status:   Future    Expected Date:   11/30/2023  Expiration Date:   11/29/2024    Release to patient::   Immediate   No orders of the defined types were placed in this encounter.         [1] No Known  Allergies [2] Current Outpatient Medications  Medication Sig Dispense Refill  . aspirin  81 mg EC tablet Take 81 mg by mouth daily. swallow whole    . atorvastatin  (LIPITOR) 20 mg tablet Take 20 mg by mouth daily.    . cyanocobalamin , vitamin B-12, 1,000 mcg/mL kit INJECT 1ML ONCE A MONTH; Duration: 270    . MULTIVITAMINS WITH FLUORIDE ORAL one tablet Orally once a day     No current facility-administered medications for this visit.  [3] No past medical history on file. [4] No past surgical history on file. [5] No family history on file.

## 2023-12-01 ENCOUNTER — Inpatient Hospital Stay (HOSPITAL_BASED_OUTPATIENT_CLINIC_OR_DEPARTMENT_OTHER)
Admission: EM | Admit: 2023-12-01 | Discharge: 2023-12-03 | DRG: 286 | Disposition: A | Discharge status: Home / Self Care | Attending: Family Medicine | Admitting: Family Medicine

## 2023-12-01 ENCOUNTER — Other Ambulatory Visit: Payer: Self-pay

## 2023-12-01 ENCOUNTER — Encounter (HOSPITAL_BASED_OUTPATIENT_CLINIC_OR_DEPARTMENT_OTHER): Payer: Self-pay | Admitting: Emergency Medicine

## 2023-12-01 ENCOUNTER — Emergency Department (HOSPITAL_BASED_OUTPATIENT_CLINIC_OR_DEPARTMENT_OTHER)

## 2023-12-01 DIAGNOSIS — I50813 Acute on chronic right heart failure: Secondary | ICD-10-CM

## 2023-12-01 DIAGNOSIS — Z8673 Personal history of transient ischemic attack (TIA), and cerebral infarction without residual deficits: Secondary | ICD-10-CM | POA: Diagnosis not present

## 2023-12-01 DIAGNOSIS — I110 Hypertensive heart disease with heart failure: Secondary | ICD-10-CM | POA: Diagnosis not present

## 2023-12-01 DIAGNOSIS — I495 Sick sinus syndrome: Secondary | ICD-10-CM | POA: Diagnosis not present

## 2023-12-01 DIAGNOSIS — I509 Heart failure, unspecified: Secondary | ICD-10-CM

## 2023-12-01 DIAGNOSIS — I350 Nonrheumatic aortic (valve) stenosis: Secondary | ICD-10-CM | POA: Diagnosis present

## 2023-12-01 DIAGNOSIS — I4891 Unspecified atrial fibrillation: Principal | ICD-10-CM | POA: Diagnosis present

## 2023-12-01 DIAGNOSIS — I5043 Acute on chronic combined systolic (congestive) and diastolic (congestive) heart failure: Secondary | ICD-10-CM | POA: Diagnosis present

## 2023-12-01 DIAGNOSIS — Z8249 Family history of ischemic heart disease and other diseases of the circulatory system: Secondary | ICD-10-CM

## 2023-12-01 DIAGNOSIS — R0602 Shortness of breath: Secondary | ICD-10-CM | POA: Diagnosis not present

## 2023-12-01 DIAGNOSIS — I493 Ventricular premature depolarization: Secondary | ICD-10-CM | POA: Diagnosis present

## 2023-12-01 DIAGNOSIS — R0601 Orthopnea: Secondary | ICD-10-CM | POA: Diagnosis not present

## 2023-12-01 DIAGNOSIS — G3184 Mild cognitive impairment, so stated: Secondary | ICD-10-CM | POA: Diagnosis present

## 2023-12-01 DIAGNOSIS — I2721 Secondary pulmonary arterial hypertension: Secondary | ICD-10-CM | POA: Diagnosis not present

## 2023-12-01 DIAGNOSIS — E785 Hyperlipidemia, unspecified: Secondary | ICD-10-CM | POA: Diagnosis present

## 2023-12-01 DIAGNOSIS — J90 Pleural effusion, not elsewhere classified: Secondary | ICD-10-CM | POA: Diagnosis not present

## 2023-12-01 DIAGNOSIS — Z7982 Long term (current) use of aspirin: Secondary | ICD-10-CM

## 2023-12-01 DIAGNOSIS — I3481 Nonrheumatic mitral (valve) annulus calcification: Secondary | ICD-10-CM | POA: Diagnosis present

## 2023-12-01 DIAGNOSIS — R0600 Dyspnea, unspecified: Secondary | ICD-10-CM | POA: Diagnosis not present

## 2023-12-01 DIAGNOSIS — Z79899 Other long term (current) drug therapy: Secondary | ICD-10-CM

## 2023-12-01 DIAGNOSIS — I5082 Biventricular heart failure: Secondary | ICD-10-CM | POA: Diagnosis present

## 2023-12-01 DIAGNOSIS — I2729 Other secondary pulmonary hypertension: Secondary | ICD-10-CM | POA: Diagnosis present

## 2023-12-01 DIAGNOSIS — R0609 Other forms of dyspnea: Secondary | ICD-10-CM | POA: Diagnosis not present

## 2023-12-01 DIAGNOSIS — I2510 Atherosclerotic heart disease of native coronary artery without angina pectoris: Secondary | ICD-10-CM | POA: Diagnosis present

## 2023-12-01 LAB — COMPREHENSIVE METABOLIC PANEL WITH GFR
ALT: 15 U/L (ref 0–44)
AST: 28 U/L (ref 15–41)
Albumin: 4.1 g/dL (ref 3.5–5.0)
Alkaline Phosphatase: 87 U/L (ref 38–126)
Anion gap: 13 (ref 5–15)
BUN: 12 mg/dL (ref 8–23)
CO2: 22 mmol/L (ref 22–32)
Calcium: 9.4 mg/dL (ref 8.9–10.3)
Chloride: 106 mmol/L (ref 98–111)
Creatinine, Ser: 0.77 mg/dL (ref 0.44–1.00)
GFR, Estimated: >60 mL/min (ref >60)
Glucose, Bld: 102 mg/dL — ABNORMAL HIGH (ref 70–99)
Potassium: 4.1 mmol/L (ref 3.5–5.1)
Sodium: 141 mmol/L (ref 135–145)
Total Bilirubin: 0.8 mg/dL (ref 0.0–1.2)
Total Protein: 7.2 g/dL (ref 6.5–8.1)

## 2023-12-01 LAB — CBC WITH DIFFERENTIAL/PLATELET
Abs Immature Granulocytes: 0.02 K/uL (ref 0.00–0.07)
Basophils Absolute: 0 K/uL (ref 0.0–0.1)
Basophils Relative: 0 %
Eosinophils Absolute: 0.1 K/uL (ref 0.0–0.5)
Eosinophils Relative: 2 %
HCT: 36.8 % (ref 36.0–46.0)
Hemoglobin: 11.9 g/dL — ABNORMAL LOW (ref 12.0–15.0)
Immature Granulocytes: 0 %
Lymphocytes Relative: 28 %
Lymphs Abs: 1.5 K/uL (ref 0.7–4.0)
MCH: 29 pg (ref 26.0–34.0)
MCHC: 32.3 g/dL (ref 30.0–36.0)
MCV: 89.5 fL (ref 80.0–100.0)
Monocytes Absolute: 0.3 K/uL (ref 0.1–1.0)
Monocytes Relative: 5 %
Neutro Abs: 3.4 K/uL (ref 1.7–7.7)
Neutrophils Relative %: 65 %
Platelets: 241 K/uL (ref 150–400)
RBC: 4.11 MIL/uL (ref 3.87–5.11)
RDW: 14 % (ref 11.5–15.5)
WBC: 5.3 K/uL (ref 4.0–10.5)
nRBC: 0 % (ref 0.0–0.2)

## 2023-12-01 LAB — MAGNESIUM: Magnesium: 2.2 mg/dL (ref 1.7–2.4)

## 2023-12-01 LAB — TROPONIN T, HIGH SENSITIVITY
Troponin T High Sensitivity: 43 ng/L — ABNORMAL HIGH (ref 0–19)
Troponin T High Sensitivity: 45 ng/L — ABNORMAL HIGH (ref 0–19)

## 2023-12-01 LAB — PRO BRAIN NATRIURETIC PEPTIDE: Pro Brain Natriuretic Peptide: 7643 pg/mL — ABNORMAL HIGH (ref <300.0)

## 2023-12-01 MED ORDER — PROCHLORPERAZINE EDISYLATE 10 MG/2ML IJ SOLN: 5.0000 mg | Freq: Four times a day (QID) | INTRAMUSCULAR | Status: DC | PRN

## 2023-12-01 MED ORDER — FUROSEMIDE 10 MG/ML IJ SOLN
20.0000 mg | Freq: Once | INTRAMUSCULAR | Status: AC
  Administered 2023-12-01: 20 mg via INTRAVENOUS
  Filled 2023-12-01: qty 2

## 2023-12-01 MED ORDER — MELATONIN 5 MG PO TABS
5.0000 mg | ORAL_TABLET | Freq: Every evening | ORAL | Status: DC | PRN
Start: 2023-12-01 — End: 2023-12-03

## 2023-12-01 MED ORDER — POLYETHYLENE GLYCOL 3350 17 G PO PACK: 17.0000 g | PACK | Freq: Every day | ORAL | Status: DC | PRN

## 2023-12-01 MED ORDER — ACETAMINOPHEN 500 MG PO TABS: 500.0000 mg | ORAL_TABLET | Freq: Four times a day (QID) | ORAL | Status: DC | PRN

## 2023-12-01 MED ORDER — APIXABAN 5 MG PO TABS
5.0000 mg | ORAL_TABLET | Freq: Two times a day (BID) | ORAL | Status: DC
  Administered 2023-12-01: 5 mg via ORAL
  Filled 2023-12-01: qty 2

## 2023-12-01 MED ORDER — METOPROLOL TARTRATE 25 MG PO TABS
12.5000 mg | ORAL_TABLET | Freq: Once | ORAL | Status: AC
  Administered 2023-12-01: 12.5 mg via ORAL
  Filled 2023-12-01: qty 1

## 2023-12-01 NOTE — ED Triage Notes (Signed)
 Pt c/o chest pressure, malaise x 4 days.  Denies fever.  Reports new cough today.

## 2023-12-01 NOTE — ED Provider Notes (Signed)
 [place] [facility] AT [facility] Provider Note   CSN: [id number] Arrival date & time: 12/01/23  1550     Patient presents with: Chest Pain   Julia SEVCIK is a 78 y.o. female.   Pt is a 78 year old female with pmh of severe aortic stenosis with mild reduced EF of 50-55% but currently being followed by cardiology, hyperlipidemia, family history of CAD, and TIA/CVA who is presenting today with complaints of 4 days of exertional dyspnea and chest tightness as well as orthopnea.  She reports that it is gradually gotten worse and now she can even walk across the room without feeling some tightness and getting winded.  She reports that she has to sit for approximately 10 to 15 minutes and then the tightness will resolve.  She denies ever having anything like this before.  She is not having any palpitations.  She did report today she has developed a mild dry cough but prior to that did not have any cough.  She has not had fever or congestion.  She denies any nausea vomiting or abdominal pain.  She has chronic lower extremity edema but reports it is no different than prior.  Currently while sitting up she reports feeling fine.  She has had no recent medication changes.  She does not take diuretics regularly.  The history is provided by the patient and medical records (her NP at her living facility).  Chest Pain      Prior to Admission medications   Medication Sig Start Date End Date Taking? Authorizing Provider  aspirin  EC 81 MG tablet Take 1 tablet (81 mg total) by mouth daily. Swallow whole. 08/14/23   Gherghe, Costin M, MD  atorvastatin  (LIPITOR) 20 MG tablet Take 1 tablet (20 mg total) by mouth daily. 08/28/23   Daneen Damien BROCKS, NP  cyanocobalamin  (,VITAMIN B-12,) 1000 MCG/ML injection Inject 1,000 mcg into the skin every 30 (thirty) days.  10/04/14   [provider]  fluticasone  (FLONASE ) 50 MCG/ACT nasal spray Place 2 sprays into both nostrils daily. 2 sprays  each nostril at night 11/07/20   Ethyl Lonni BRAVO, MD  hydroxypropyl methylcellulose / hypromellose (ISOPTO TEARS / GONIOVISC) 2.5 % ophthalmic solution Place 1 drop into both eyes 4 (four) times daily as needed for dry eyes.    [provider]  Multiple Vitamin (MULTIVITAMIN WITH MINERALS) TABS tablet Take 1 tablet by mouth daily.    [provider]  triamcinolone  (NASACORT ) 55 MCG/ACT AERO nasal inhaler Place 2 sprays into the nose daily. 2 sprays each nostril at night 11/13/20   Ethyl Lonni BRAVO, MD    Allergies: Patient has no known allergies.    Review of Systems  Cardiovascular:  Positive for chest pain.    Updated Vital Signs BP 114/79   Pulse 91   Temp 98.2 F (36.8 C)   Resp (!) 21   Ht 5' 5 (1.651 m)   Wt 80.3 kg   SpO2 94%   BMI 29.45 kg/m   Physical Exam Vitals and nursing note reviewed.  Constitutional:      General: She is not in acute distress.    Appearance: She is well-developed.  HENT:     Head: Normocephalic and atraumatic.  Eyes:     Pupils: Pupils are equal, round, and reactive to light.  Cardiovascular:     Rate and Rhythm: Tachycardia present. Rhythm irregularly irregular.     Pulses: Normal pulses.     Heart sounds: Murmur heard.  No friction rub.  Pulmonary:     Effort: Pulmonary effort is normal.     Breath sounds: Normal breath sounds. No wheezing or rales.  Abdominal:     General: Bowel sounds are normal. There is no distension.     Palpations: Abdomen is soft.     Tenderness: There is no abdominal tenderness. There is no guarding or rebound.  Musculoskeletal:        General: No tenderness. Normal range of motion.     Cervical back: Normal range of motion and neck supple.     Right lower leg: Edema present.     Left lower leg: Edema present.     Comments: No edema  Skin:    General: Skin is warm and dry.     Findings: No rash.  Neurological:     Mental Status: She is alert and oriented to person, place,  and time. Mental status is at baseline.     Cranial Nerves: No cranial nerve deficit.  Psychiatric:        Behavior: Behavior normal.     (all labs ordered are listed, but only abnormal results are displayed) Labs Reviewed  CBC WITH DIFFERENTIAL/PLATELET - Abnormal; Notable for the following components:      Result Value   Hemoglobin 11.9 (*)    All other components within normal limits  COMPREHENSIVE METABOLIC PANEL WITH GFR - Abnormal; Notable for the following components:   Glucose, Bld 102 (*)    All other components within normal limits  PRO BRAIN NATRIURETIC PEPTIDE - Abnormal; Notable for the following components:   Pro Brain Natriuretic Peptide 7,643.0 (*)    All other components within normal limits  TROPONIN T, HIGH SENSITIVITY - Abnormal; Notable for the following components:   Troponin T High Sensitivity 43 (*)    All other components within normal limits  MAGNESIUM  TROPONIN T, HIGH SENSITIVITY    EKG: EKG Interpretation Date/Time:  Tuesday December 01 2023 16:13:17 EST Ventricular Rate:  93 PR Interval:    QRS Duration:  94 QT Interval:  337 QTC Calculation: 420 R Axis:   80  Text Interpretation: new Atrial fibrillation Ventricular bigeminy Minimal ST elevation, lateral leads   Confirmed by Doretha Folks ([id number]) on 12/01/2023 4:33:44 PM  Radiology: [facility] Chest 2 View Result Date: 12/01/2023 EXAM: 2 VIEW(S) XRAY OF THE CHEST 12/01/2023 04:38:45 PM COMPARISON: 08/12/2023 CLINICAL HISTORY: sob sob FINDINGS: LUNGS AND PLEURA: No focal pulmonary opacity. No pulmonary edema. Blunting of the right costophrenic angle could reflect small pleural effusion. No pneumothorax. HEART AND MEDIASTINUM: No acute abnormality of the cardiac and mediastinal silhouettes. BONES AND SOFT TISSUES: No acute osseous abnormality. IMPRESSION: 1. Small right pleural effusion suspected based on blunting of the right costophrenic angle. Electronically signed by: Franky Crease MD 12/01/2023  05:18 PM EST RP Workstation: [id number]     Procedures   Medications Ordered in the ED  apixaban (ELIQUIS) tablet 5 mg (has no administration in time range)  metoprolol tartrate (LOPRESSOR) tablet 12.5 mg (12.5 mg Oral Given 12/01/23 1743)  furosemide (LASIX) injection 20 mg (20 mg Intravenous Given 12/01/23 1745)                                    Medical Decision Making Amount and/or Complexity of Data Reviewed Independent Historian:     Details: pcp External Data Reviewed: notes. Labs: ordered. Decision-making details documented in ED Course.  Radiology: ordered and independent interpretation performed. Decision-making details documented in ED Course. ECG/medicine tests: ordered and independent interpretation performed. Decision-making details documented in ED Course.  Risk Prescription drug management. Decision regarding hospitalization.   Pt with multiple medical problems and comorbidities and presenting today with a complaint that caries a high risk for morbidity and mortality.  Here today with the above complaints.  Concern for CHF due to new A-fib, ACS, AKI or electrolyte abnormalities.  Also concern for possible anemia.  Patient is very well-appearing on exam but she is at rest currently.  On exam patient does appear to be in A-fib with an irregular heartbeat.  I independently interpreted patient's labs and EKG.  EKG today does show atrial fibrillation with frequent PVCs and at times bigeminy.  No significant ST changes.  CBC within normal limits, CMP without acute findings, magnesium is normal, troponin mildly elevated at 43 and BNP elevated at 7643. I have independently visualized and interpreted pt's images today.  Chest x-ray with evidence of mild pulmonary edema and cardiomegaly.  Will give IV lasix for fluid overload.  Will consult cardiology. Spoke with Dr. Sheena with cardiology who feels that patient would benefit from cardioversion and they will consult on her when she  arrives at the hospital.  Patient started on oral metoprolol, IV Lasix for diuresis and Eliquis.  Cardiology is agreeable to this.  Consulted hospitalist for admission.  All of these things were discussed with the patient and she is comfortable with this plan. CHADVASC score of 5    CRITICAL CARE Performed by: Saira Kramme Total critical care time: 45 minutes Critical care time was exclusive of separately billable procedures and treating other patients. Critical care was necessary to treat or prevent imminent or life-threatening deterioration. Critical care was time spent personally by me on the following activities: development of treatment plan with patient and/or surrogate as well as nursing, discussions with consultants, evaluation of patient's response to treatment, examination of patient, obtaining history from patient or surrogate, ordering and performing treatments and interventions, ordering and review of laboratory studies, ordering and review of radiographic studies, pulse oximetry and re-evaluation of patient's condition.        Final diagnoses:  Atrial fibrillation, unspecified type (HCC)  Acute congestive heart failure, unspecified heart failure type [facility])    ED Discharge Orders     None          Doretha Folks, MD 12/01/23 1801

## 2023-12-01 NOTE — Plan of Care (Signed)
 Mrs. Julia Bowman is a 78 year old female with history of TIA, hyperlipidemia, and aortic stenosis who presents with complaints of shortness of breath and orthopnea.  O2 saturations currently maintained on room air.  Found to be in new onset atrial fibrillation with heart rates into the 110s.  Labs significant for proBNP 7643 and high-sensitivity troponin 48.  Chest x-ray showed a small right pleural effusion suspected on blunting of the right costophrenic angle.  Patient was given metoprolol 12.5 mg p.o. x 1 dose and Lasix 20 mg IV.  Cardiology had been formally consulted.  Accepted as as observation to a telemetry bed.

## 2023-12-01 NOTE — Consult Note (Incomplete)
 Cardiology Consultation   Patient ID: [name] MRN: [id number]; DOB: [birth year]  Admit date: 12/01/2023 Date of Consult: 12/01/2023  PCP:  Feliciano Devoria LABOR, MD   [facility] Providers Cardiologist:  Annabella Scarce, MD      Patient Profile: Julia Bowman is a 78 y.o. female with a hx of aortic stenosis, hyperlipidemia, and recent TIA who is being seen 12/01/2023 for the evaluation of new atrial fibrillation at the request of Benton Shone MD.  History of Present Illness: Julia Bowman   Patient was recently admitted for TIA 07/2023 after she presented with dizziness and gait disturbance. She underwent an MRI of the brain which showed possible punctate acute infarct in the left postcentral gyrus versus artifact. Underwent a CT angiogram which was without any large vessel occlusions. Echo this admission showed EF 50 to 55%, low normal LV function, LV global hypokinesis, mild concentric LVH, mildly reduced RV systolic function, mild to moderate aortic valve regurgitation, severe aortic stenosis, mean gradient 39.7 mmHg . She was discharged on DAPT for 21 days, lipitor and advised to follow up with cardiology outpatient.   Patient had cardiology follow up on 08/2023 with [name] NP. As patient was asymptomatic decision was made to pursue follow up echocardiogram in 6 months then reassess progression prior to referral to structural heart team. Additionally, given recent TIA/Cva a 30 day heart monitor was ordered, though only worn for 7 days which revealed sinus rhythm with minimal ectopy. No evidence of AF.   Past Medical History:  Diagnosis Date   Dysrhythmia     Past Surgical History:  Procedure Laterality Date   ABDOMINAL HYSTERECTOMY     BREAST BIOPSY     COLONOSCOPY WITH PROPOFOL  N/A 01/17/2015   Procedure: COLONOSCOPY WITH PROPOFOL ;  Surgeon: Elsie Cree, MD;  Location: [facility];  Service: Endoscopy;  Laterality: N/A;   HAND SURGERY     STOMACH  SURGERY     TONSILLECTOMY       {Home Medications (Optional):21181}  Scheduled Meds:  furosemide  20 mg Intravenous Once   metoprolol tartrate  12.5 mg Oral Once   Continuous Infusions:  PRN Meds:   Allergies:   No Known Allergies  Social History:   Social History   Socioeconomic History   Marital status: Married    Spouse name: Not on file   Number of children: Not on file   Years of education: Not on file   Highest education level: Not on file  Occupational History   Not on file  Tobacco Use   Smoking status: Never   Smokeless tobacco: Not on file  Substance and Sexual Activity   Alcohol use: No   Drug use: No   Sexual activity: Not on file  Other Topics Concern   Not on file  Social History Narrative   Not on file   Social Drivers of Health   Financial Resource Strain: Not on file  Food Insecurity: No Food Insecurity (08/12/2023)   Hunger Vital Sign    Worried About Running Out of Food in the Last Year: Never true    Ran Out of Food in the Last Year: Never true  Transportation Needs: No Transportation Needs (08/12/2023)   PRAPARE - Administrator, Civil Service (Medical): No    Lack of Transportation (Non-Medical): No  Physical Activity: Not on file  Stress: Not on file  Social Connections: Moderately Isolated (08/12/2023)   Social Connection and Isolation Panel    Frequency  of Communication with Friends and Family: Three times a week    Frequency of Social Gatherings with Friends and Family: Once a week    Attends Religious Services: More than 4 times per year    Active Member of [employer] or Organizations: No    Attends Banker Meetings: Never    Marital Status: Widowed  Intimate Partner Violence: Not At Risk (08/12/2023)   Humiliation, Afraid, Rape, and Kick questionnaire    Fear of Current or Ex-Partner: No    Emotionally Abused: No    Physically Abused: No    Sexually Abused: No    Family History:   ***History reviewed. No  pertinent family history.   ROS:  Please see the history of present illness.  *** All other ROS reviewed and negative.     Physical Exam/Data: Vitals:   12/01/23 1611  BP: (!) 123/90  Pulse: (!) 114  Resp: 15  Temp: 98.2 F (36.8 C)  SpO2: 97%  Weight: 80.3 kg  Height: 5' 5 (1.651 m)   No intake or output data in the 24 hours ending 12/01/23 1722    12/01/2023    4:11 PM 08/28/2023    1:38 PM 08/12/2023    3:38 PM  Last 3 Weights  Weight (lbs) 177 lb 181 lb 6.4 oz 181 lb  Weight (kg) 80.287 kg 82.283 kg 82.101 kg     Body mass index is 29.45 kg/m.  General:  Well nourished, well developed, in no acute distress*** HEENT: normal Neck: no JVD Vascular: No carotid bruits; Distal pulses 2+ bilaterally Cardiac:  normal S1, S2; RRR; no murmur *** Lungs:  clear to auscultation bilaterally, no wheezing, rhonchi or rales  Abd: soft, nontender, no hepatomegaly  Ext: no edema Musculoskeletal:  No deformities, BUE and BLE strength normal and equal Skin: warm and dry  Neuro:  CNs 2-12 intact, no focal abnormalities noted Psych:  Normal affect   EKG:  The EKG was personally reviewed and demonstrates:  *** Telemetry:  Telemetry was personally reviewed and demonstrates:  ***  Relevant CV Studies: ***  Laboratory Data: High Sensitivity Troponin:  No results for input(s): TROPONINIHS in the last 720 hours.   Chemistry Recent Labs  Lab 12/01/23 1618  NA 141  K 4.1  CL 106  CO2 22  GLUCOSE 102*  BUN 12  CREATININE 0.77  CALCIUM  9.4  MG 2.2  GFRNONAA >60  ANIONGAP 13    Recent Labs  Lab 12/01/23 1618  PROT 7.2  ALBUMIN 4.1  AST 28  ALT 15  ALKPHOS 87  BILITOT 0.8   Lipids No results for input(s): CHOL, TRIG, HDL, LABVLDL, LDLCALC, CHOLHDL in the last 168 hours.  Hematology Recent Labs  Lab 12/01/23 1618  WBC 5.3  RBC 4.11  HGB 11.9*  HCT 36.8  MCV 89.5  MCH 29.0  MCHC 32.3  RDW 14.0  PLT 241   Thyroid  No results for input(s): TSH,  FREET4 in the last 168 hours.  BNP Recent Labs  Lab 12/01/23 1618  PROBNP 7,643.0*    DDimer No results for input(s): DDIMER in the last 168 hours.  Radiology/Studies:  DG Chest 2 View Result Date: 12/01/2023 EXAM: 2 VIEW(S) XRAY OF THE CHEST 12/01/2023 04:38:45 PM COMPARISON: 08/12/2023 CLINICAL HISTORY: sob sob FINDINGS: LUNGS AND PLEURA: No focal pulmonary opacity. No pulmonary edema. Blunting of the right costophrenic angle could reflect small pleural effusion. No pneumothorax. HEART AND MEDIASTINUM: No acute abnormality of the cardiac and mediastinal silhouettes. BONES  AND SOFT TISSUES: No acute osseous abnormality. IMPRESSION: 1. Small right pleural effusion suspected based on blunting of the right costophrenic angle. Electronically signed by: Franky Crease MD 12/01/2023 05:18 PM EST RP Workstation: [id number]     Assessment and Plan: ***   Risk Assessment/Risk Scores: {Complete the following score calculators/questions to meet required metrics.  Press F2         :[id number]}   {Is the patient being seen for unstable angina, ACS, NSTEMI or STEMI?[phone]} {Does this patient have CHF or CHF symptoms?      :[phone]} {Does this patient have ATRIAL FIBRILLATION?:[phone]}  {Are we signing off today?:[id number]}  For questions or updates, please contact [facility] Please consult [email or web address] for contact info under    {TIP  Split Shared Billing  Do NOT delete any part of this including brackets If split shared billing is based upon MDM, disregard If billing will be based upon TIME you MUST document the number of minutes and a detailed list of what was done in that time in the following format Example - I spent ** minutes seeing this patient. During that time I reviewed their history, evaluated their symptoms, reviewed available labs, EKGs, studies, performed an exam and formulated an assessment and plan   :1} {Select this only if you need to document critical  care time (Optional):[phone]} Signed, Leontine LOISE Salen, PA-C  12/01/2023 5:22 PM

## 2023-12-01 NOTE — H&P (Incomplete)
 History and Physical  Julia Bowman FMW:[id number] DOB: 05-29-1945 DOA: 12/01/2023  Referring physician: Accepted by Dr. Claudene, [facility], hospitalist service. PCP: Feliciano Devoria LABOR, MD  Outpatient Specialists: Cardiology. Patient coming from: Home through [facility] ED  Chief Complaint: Shortness of breath and difficulty laying flat.  HPI: Julia Bowman is a 78 y.o. female with medical history significant for severe aortic stenosis, hypertension, hyperlipidemia, mild cognitive impairment, who presents with complaints of shortness of breath and difficulty lying down flat for the past few days.  Denies syncope, chest pain, or palpitations..  Recently admitted for TIA workup on 08/12/23.  At that time, a transthoracic echo was done as part of the TIA workup.  It revealed severe aortic stenosis and left ventricular global hypokinesis with LVEF of 50 to 55%.  She was discharged on DAPT for 21 days along with Lipitor, and advised to follow-up with cardiology outpatient.  She initially followed up with cardiology on 08/28/23 and was ordered a 30-day heart monitor which she only wore for 7 days.  It reveals sinus rhythm with minimal ectopy without evidence of atrial fibrillation.  In the ER, vital signs were stable however the patient was noted to be in A-fib with rates in the 110s.  proBNP was greater than 7600.  A chest x-ray was done which showed small right pleural effusion.  The patient received 1 dose of IV Lasix 20 mg x 1 and a dose of p.o. Lopressor 12.5 mg x 1.  Admitted by hospitalist service, [facility].  Accepted by Dr. Claudene, and transferred to [facility] telemetry unit as observation status.  At the time of this visit, the patient is alert and oriented x 3.  Denies having any chest pain or palpitations.  Endorses being very active with regular exercise and daily walks at her retirement community.  Father deceased of MI at the age of 99.  ED Course: Temperature 98.2.  BP 111/60, pulse 90, respiration  rate 15, O2 saturation 95% on room air  Review of Systems: Review of systems as noted in the HPI. All other systems reviewed and are negative.   Past Medical History:  Diagnosis Date   Dysrhythmia    Past Surgical History:  Procedure Laterality Date   ABDOMINAL HYSTERECTOMY     BREAST BIOPSY     COLONOSCOPY WITH PROPOFOL  N/A 01/17/2015   Procedure: COLONOSCOPY WITH PROPOFOL ;  Surgeon: Elsie Cree, MD;  Location: [facility];  Service: Endoscopy;  Laterality: N/A;   HAND SURGERY     STOMACH SURGERY     TONSILLECTOMY      Social History:  reports that she has never smoked. She does not have any smokeless tobacco history on file. She reports that she does not drink alcohol and does not use drugs.   No Known Allergies  Family history: None reported.  Prior to Admission medications   Medication Sig Start Date End Date Taking? Authorizing Provider  aspirin  EC 81 MG tablet Take 1 tablet (81 mg total) by mouth daily. Swallow whole. 08/14/23   Gherghe, Costin M, MD  atorvastatin  (LIPITOR) 20 MG tablet Take 1 tablet (20 mg total) by mouth daily. 08/28/23   Daneen Damien BROCKS, NP  cyanocobalamin  (,VITAMIN B-12,) 1000 MCG/ML injection Inject 1,000 mcg into the skin every 30 (thirty) days.  10/04/14   [provider]  fluticasone  (FLONASE ) 50 MCG/ACT nasal spray Place 2 sprays into both nostrils daily. 2 sprays each nostril at night 11/07/20   Ethyl Lonni BRAVO, MD  hydroxypropyl methylcellulose / hypromellose (ISOPTO TEARS / GONIOVISC) 2.5 % ophthalmic solution Place 1 drop into both eyes 4 (four) times daily as needed for dry eyes.    [provider]  Multiple Vitamin (MULTIVITAMIN WITH MINERALS) TABS tablet Take 1 tablet by mouth daily.    [provider]  triamcinolone  (NASACORT ) 55 MCG/ACT AERO nasal inhaler Place 2 sprays into the nose daily. 2 sprays each nostril at night 11/13/20   Ethyl Lonni BRAVO, MD    Physical Exam: BP 132/85 (BP Location: Left  Arm)   Pulse 97   Temp 97.9 F (36.6 C) (Oral)   Resp 20   Ht 5' 5 (1.651 m)   Wt 80.3 kg   SpO2 97%   BMI 29.45 kg/m   General: 78 y.o. year-old female well developed well nourished in no acute distress.  Alert and oriented x3. Cardiovascular: Regular rate and rhythm with no rubs or gallops.  Grade 3 systolic murmur noted.  No thyromegaly or JVD noted.  No lower extremity edema. 2/4 pulses in all 4 extremities. Respiratory: Clear to auscultation with no wheezes or rales. Good inspiratory effort. Abdomen: Soft nontender nondistended with normal bowel sounds x4 quadrants. Muskuloskeletal: No cyanosis, clubbing or edema noted bilaterally Neuro: CN II-XII intact, strength, sensation, reflexes Skin: No ulcerative lesions noted or rashes Psychiatry: Judgement and insight appear normal. Mood is appropriate for condition and setting          Labs on Admission:  Basic Metabolic Panel: Recent Labs  Lab 12/01/23 1618  NA 141  K 4.1  CL 106  CO2 22  GLUCOSE 102*  BUN 12  CREATININE 0.77  CALCIUM  9.4  MG 2.2   Liver Function Tests: Recent Labs  Lab 12/01/23 1618  AST 28  ALT 15  ALKPHOS 87  BILITOT 0.8  PROT 7.2  ALBUMIN 4.1   No results for input(s): LIPASE, AMYLASE in the last 168 hours. No results for input(s): AMMONIA in the last 168 hours. CBC: Recent Labs  Lab 12/01/23 1618  WBC 5.3  NEUTROABS 3.4  HGB 11.9*  HCT 36.8  MCV 89.5  PLT 241   Cardiac Enzymes: No results for input(s): CKTOTAL, CKMB, CKMBINDEX, TROPONINI in the last 168 hours.  BNP (last 3 results) No results for input(s): BNP in the last 8760 hours.  ProBNP (last 3 results) Recent Labs    12/01/23 1618  PROBNP 7,643.0*    CBG: No results for input(s): GLUCAP in the last 168 hours.  Radiological Exams on Admission: DG Chest 2 View Result Date: 12/01/2023 EXAM: 2 VIEW(S) XRAY OF THE CHEST 12/01/2023 04:38:45 PM COMPARISON: 08/12/2023 CLINICAL HISTORY: sob sob  FINDINGS: LUNGS AND PLEURA: No focal pulmonary opacity. No pulmonary edema. Blunting of the right costophrenic angle could reflect small pleural effusion. No pneumothorax. HEART AND MEDIASTINUM: No acute abnormality of the cardiac and mediastinal silhouettes. BONES AND SOFT TISSUES: No acute osseous abnormality. IMPRESSION: 1. Small right pleural effusion suspected based on blunting of the right costophrenic angle. Electronically signed by: Franky Crease MD 12/01/2023 05:18 PM EST RP Workstation: [id number]    EKG: I independently viewed the EKG done and my findings are as followed: Sinus rhythm rate of 93.  Nonspecific ST changes.  TC 479.  Assessment/Plan Present on Admission: **None**  Principal Problem:   Acute exacerbation of CHF (congestive heart failure) (HCC)  Acute exacerbation of likely chronic HFpEF Last 2D echo done on 08/13/2023 revealed LVEF 50 to 55% with left ventricular global hypokinesis, severe aortic  valve stenosis. proBNP greater than 7000 Received IV Lasix 20 mg x 1 in the ER Closely monitor volume status. Monitor strict I's and O's and daily weight Follow transthoracic echocardiogram Rest of management per cardiology  Severe aortic valve stenosis Awaiting cardiology evaluation for possible TAVR Closely monitor volume status Follow transthoracic echocardiogram  New onset atrial fibrillation, now rate controlled. Was started on Eliquis, continue Received p.o. Lopressor 12.5 mg x 1 in the ER. Rest of management per cardiology Monitor on telemetry.  Hyperlipidemia Resume home Lipitor.    Time: 75 minutes.    DVT prophylaxis: Eliquis twice daily.  Code Status: Full code.  Family Communication: None at bedside.  Disposition Plan: Admitted to telemetry unit.  Consults called: Cardiology was consulted by EDP.  Admission status: Observation status.   Status is: Observation    Terry LOISE Hurst MD [facility] Pager [phone]  If 7PM-7AM,  please contact night-coverage [email or web address] Password [facility]  12/01/2023, 11:43 PM

## 2023-12-01 NOTE — H&P (Incomplete)
 History and Physical  Julia Bowman FMW:[id number] DOB: 01-13-1946 DOA: 12/01/2023  Referring physician: Accepted by Dr. Claudene, [facility], hospitalist service. PCP: Julia Devoria LABOR, MD  Outpatient Specialists: Cardiology. Patient coming from: Home through [facility] ED  Chief Complaint: Shortness of breath and difficulty laying flat.  HPI: Julia Bowman is a 78 y.o. female with medical history significant for severe aortic stenosis, hypertension, hyperlipidemia, memory impairment, recently admitted for TIA workup on 08/12/23, a transthoracic echo done as part of the TIA workup revealed severe aortic stenosis and left ventricular global hypokinesis with LVEF of 50 to 55%, she was discharged on DAPT for 21 days with Lipitor, and advised to follow-up with cardiology outpatient.  She initially followed up with cardiology on 08/28/23 and was ordered a 30-day heart monitor which she only wore for 7 days.  It reveals sinus rhythm with minimal ectopy without evidence of atrial fibrillation.  She endorses chest pressure malaise x 4 days.  Nonproductive cough and no subjective fevers.      ED Course: ***  Review of Systems: Review of systems as noted in the HPI. All other systems reviewed and are negative.   Past Medical History:  Diagnosis Date  . Dysrhythmia    Past Surgical History:  Procedure Laterality Date  . ABDOMINAL HYSTERECTOMY    . BREAST BIOPSY    . COLONOSCOPY WITH PROPOFOL  N/A 01/17/2015   Procedure: COLONOSCOPY WITH PROPOFOL ;  Surgeon: Elsie Cree, MD;  Location: [facility];  Service: Endoscopy;  Laterality: N/A;  . HAND SURGERY    . STOMACH SURGERY    . TONSILLECTOMY      Social History:  reports that she has never smoked. She does not have any smokeless tobacco history on file. She reports that she does not drink alcohol and does not use drugs.   No Known Allergies  History reviewed. No pertinent family history.  ***  Prior to Admission medications   Medication Sig  Start Date End Date Taking? Authorizing Provider  aspirin  EC 81 MG tablet Take 1 tablet (81 mg total) by mouth daily. Swallow whole. 08/14/23   Gherghe, Costin M, MD  atorvastatin  (LIPITOR) 20 MG tablet Take 1 tablet (20 mg total) by mouth daily. 08/28/23   Daneen Damien BROCKS, NP  cyanocobalamin  (,VITAMIN B-12,) 1000 MCG/ML injection Inject 1,000 mcg into the skin every 30 (thirty) days.  10/04/14   [provider]  fluticasone  (FLONASE ) 50 MCG/ACT nasal spray Place 2 sprays into both nostrils daily. 2 sprays each nostril at night 11/07/20   Ethyl Lonni BRAVO, MD  hydroxypropyl methylcellulose / hypromellose (ISOPTO TEARS / GONIOVISC) 2.5 % ophthalmic solution Place 1 drop into both eyes 4 (four) times daily as needed for dry eyes.    [provider]  Multiple Vitamin (MULTIVITAMIN WITH MINERALS) TABS tablet Take 1 tablet by mouth daily.    [provider]  triamcinolone  (NASACORT ) 55 MCG/ACT AERO nasal inhaler Place 2 sprays into the nose daily. 2 sprays each nostril at night 11/13/20   Ethyl Lonni BRAVO, MD    Physical Exam: BP 132/85 (BP Location: Left Arm)   Pulse 97   Temp 97.9 F (36.6 C) (Oral)   Resp 20   Ht 5' 5 (1.651 m)   Wt 80.3 kg   SpO2 97%   BMI 29.45 kg/m   General: 78 y.o. year-old female well developed well nourished in no acute distress.  Alert and oriented x3. Cardiovascular: Regular rate and rhythm with no rubs or gallops.  No thyromegaly or JVD noted.  No lower extremity edema. 2/4 pulses in all 4 extremities. Respiratory: Clear to auscultation with no wheezes or rales. Good inspiratory effort. Abdomen: Soft nontender nondistended with normal bowel sounds x4 quadrants. Muskuloskeletal: No cyanosis, clubbing or edema noted bilaterally Neuro: CN II-XII intact, strength, sensation, reflexes Skin: No ulcerative lesions noted or rashes Psychiatry: Judgement and insight appear normal. Mood is appropriate for condition and setting           Labs on Admission:  Basic Metabolic Panel: Recent Labs  Lab 12/01/23 1618  NA 141  K 4.1  CL 106  CO2 22  GLUCOSE 102*  BUN 12  CREATININE 0.77  CALCIUM  9.4  MG 2.2   Liver Function Tests: Recent Labs  Lab 12/01/23 1618  AST 28  ALT 15  ALKPHOS 87  BILITOT 0.8  PROT 7.2  ALBUMIN 4.1   No results for input(s): LIPASE, AMYLASE in the last 168 hours. No results for input(s): AMMONIA in the last 168 hours. CBC: Recent Labs  Lab 12/01/23 1618  WBC 5.3  NEUTROABS 3.4  HGB 11.9*  HCT 36.8  MCV 89.5  PLT 241   Cardiac Enzymes: No results for input(s): CKTOTAL, CKMB, CKMBINDEX, TROPONINI in the last 168 hours.  BNP (last 3 results) No results for input(s): BNP in the last 8760 hours.  ProBNP (last 3 results) Recent Labs    12/01/23 1618  PROBNP 7,643.0*    CBG: No results for input(s): GLUCAP in the last 168 hours.  Radiological Exams on Admission: DG Chest 2 View Result Date: 12/01/2023 EXAM: 2 VIEW(S) XRAY OF THE CHEST 12/01/2023 04:38:45 PM COMPARISON: 08/12/2023 CLINICAL HISTORY: sob sob FINDINGS: LUNGS AND PLEURA: No focal pulmonary opacity. No pulmonary edema. Blunting of the right costophrenic angle could reflect small pleural effusion. No pneumothorax. HEART AND MEDIASTINUM: No acute abnormality of the cardiac and mediastinal silhouettes. BONES AND SOFT TISSUES: No acute osseous abnormality. IMPRESSION: 1. Small right pleural effusion suspected based on blunting of the right costophrenic angle. Electronically signed by: Franky Crease MD 12/01/2023 05:18 PM EST RP Workstation: [id number]    EKG: I independently viewed the EKG done and my findings are as followed: ***   Assessment/Plan Present on Admission: **None**  Principal Problem:   Acute exacerbation of CHF (congestive heart failure) (HCC)   DVT prophylaxis: ***   Code Status: ***   Family Communication: ***   Disposition Plan: ***   Consults called: ***   Admission  status: ***    Status is: Observation {Observation:23811}   Terry LOISE Hurst MD [facility] Pager [phone]  If 7PM-7AM, please contact night-coverage [email or web address] Password [facility]  12/01/2023, 11:43 PM

## 2023-12-02 ENCOUNTER — Observation Stay (HOSPITAL_COMMUNITY)

## 2023-12-02 DIAGNOSIS — I5033 Acute on chronic diastolic (congestive) heart failure: Secondary | ICD-10-CM

## 2023-12-02 DIAGNOSIS — I493 Ventricular premature depolarization: Secondary | ICD-10-CM | POA: Diagnosis not present

## 2023-12-02 DIAGNOSIS — I50813 Acute on chronic right heart failure: Secondary | ICD-10-CM | POA: Diagnosis not present

## 2023-12-02 DIAGNOSIS — I509 Heart failure, unspecified: Secondary | ICD-10-CM | POA: Diagnosis not present

## 2023-12-02 DIAGNOSIS — I272 Pulmonary hypertension, unspecified: Secondary | ICD-10-CM | POA: Diagnosis not present

## 2023-12-02 DIAGNOSIS — Z8249 Family history of ischemic heart disease and other diseases of the circulatory system: Secondary | ICD-10-CM | POA: Diagnosis not present

## 2023-12-02 DIAGNOSIS — Z7982 Long term (current) use of aspirin: Secondary | ICD-10-CM | POA: Diagnosis not present

## 2023-12-02 DIAGNOSIS — Z79899 Other long term (current) drug therapy: Secondary | ICD-10-CM | POA: Diagnosis not present

## 2023-12-02 DIAGNOSIS — I4891 Unspecified atrial fibrillation: Secondary | ICD-10-CM | POA: Diagnosis not present

## 2023-12-02 DIAGNOSIS — Z8673 Personal history of transient ischemic attack (TIA), and cerebral infarction without residual deficits: Secondary | ICD-10-CM | POA: Diagnosis not present

## 2023-12-02 DIAGNOSIS — I2510 Atherosclerotic heart disease of native coronary artery without angina pectoris: Secondary | ICD-10-CM | POA: Diagnosis not present

## 2023-12-02 DIAGNOSIS — I350 Nonrheumatic aortic (valve) stenosis: Secondary | ICD-10-CM

## 2023-12-02 DIAGNOSIS — I5043 Acute on chronic combined systolic (congestive) and diastolic (congestive) heart failure: Secondary | ICD-10-CM | POA: Diagnosis not present

## 2023-12-02 DIAGNOSIS — I2729 Other secondary pulmonary hypertension: Secondary | ICD-10-CM | POA: Diagnosis not present

## 2023-12-02 DIAGNOSIS — I5082 Biventricular heart failure: Secondary | ICD-10-CM | POA: Diagnosis not present

## 2023-12-02 DIAGNOSIS — I3481 Nonrheumatic mitral (valve) annulus calcification: Secondary | ICD-10-CM | POA: Diagnosis not present

## 2023-12-02 DIAGNOSIS — G3184 Mild cognitive impairment, so stated: Secondary | ICD-10-CM | POA: Diagnosis not present

## 2023-12-02 DIAGNOSIS — E785 Hyperlipidemia, unspecified: Secondary | ICD-10-CM | POA: Diagnosis not present

## 2023-12-02 DIAGNOSIS — I110 Hypertensive heart disease with heart failure: Secondary | ICD-10-CM | POA: Diagnosis not present

## 2023-12-02 LAB — CBC
HCT: 35 % — ABNORMAL LOW (ref 36.0–46.0)
Hemoglobin: 11.3 g/dL — ABNORMAL LOW (ref 12.0–15.0)
MCH: 29 pg (ref 26.0–34.0)
MCHC: 32.3 g/dL (ref 30.0–36.0)
MCV: 89.7 fL (ref 80.0–100.0)
Platelets: 227 K/uL (ref 150–400)
RBC: 3.9 MIL/uL (ref 3.87–5.11)
RDW: 13.9 % (ref 11.5–15.5)
WBC: 5.4 K/uL (ref 4.0–10.5)
nRBC: 0 % (ref 0.0–0.2)

## 2023-12-02 LAB — MAGNESIUM: Magnesium: 2 mg/dL (ref 1.7–2.4)

## 2023-12-02 LAB — ECHOCARDIOGRAM LIMITED
AR max vel: 0.55 cm2
AV Area VTI: 0.51 cm2
AV Area mean vel: 0.51 cm2
AV Mean grad: 51 mmHg
AV Peak grad: 80.1 mmHg
Ao pk vel: 4.48 m/s
Calc EF: 32.1 %
Height: 65 in
MV VTI: 1.9 cm2
S' Lateral: 4.8 cm
Single Plane A2C EF: 29.4 %
Single Plane A4C EF: 35.1 %
Weight: 2899.49 [oz_av]

## 2023-12-02 LAB — BASIC METABOLIC PANEL WITH GFR
Anion gap: 12 (ref 5–15)
BUN: 13 mg/dL (ref 8–23)
CO2: 25 mmol/L (ref 22–32)
Calcium: 8.8 mg/dL — ABNORMAL LOW (ref 8.9–10.3)
Chloride: 104 mmol/L (ref 98–111)
Creatinine, Ser: 0.87 mg/dL (ref 0.44–1.00)
GFR, Estimated: >60 mL/min (ref >60)
Glucose, Bld: 100 mg/dL — ABNORMAL HIGH (ref 70–99)
Potassium: 3.5 mmol/L (ref 3.5–5.1)
Sodium: 141 mmol/L (ref 135–145)

## 2023-12-02 LAB — PHOSPHORUS: Phosphorus: 4.5 mg/dL (ref 2.5–4.6)

## 2023-12-02 LAB — GLUCOSE, CAPILLARY: Glucose-Capillary: 116 mg/dL — ABNORMAL HIGH (ref 70–99)

## 2023-12-02 LAB — HEPARIN LEVEL (UNFRACTIONATED): Heparin Unfractionated: 0.84 [IU]/mL — ABNORMAL HIGH (ref 0.30–0.70)

## 2023-12-02 LAB — APTT: aPTT: 38 s — ABNORMAL HIGH (ref 24–36)

## 2023-12-02 MED ORDER — FUROSEMIDE 10 MG/ML IJ SOLN
40.0000 mg | Freq: Every day | INTRAMUSCULAR | Status: DC
  Filled 2023-12-02: qty 4

## 2023-12-02 MED ORDER — FREE WATER
250.0000 mL | Freq: Once | Status: AC
  Administered 2023-12-03: 250 mL via ORAL

## 2023-12-02 MED ORDER — HEPARIN (PORCINE) 25000 UT/250ML-% IV SOLN
1350.0000 [IU]/h | INTRAVENOUS | Status: DC
  Administered 2023-12-02: 1050 [IU]/h via INTRAVENOUS
  Filled 2023-12-02: qty 250

## 2023-12-02 MED ORDER — ASPIRIN 81 MG PO TBEC
81.0000 mg | DELAYED_RELEASE_TABLET | Freq: Every day | ORAL | Status: DC
  Administered 2023-12-02 – 2023-12-03 (×2): 81 mg via ORAL
  Filled 2023-12-02 (×2): qty 1

## 2023-12-02 MED ORDER — POTASSIUM CHLORIDE CRYS ER 20 MEQ PO TBCR
30.0000 meq | EXTENDED_RELEASE_TABLET | Freq: Two times a day (BID) | ORAL | Status: AC
  Administered 2023-12-02 (×2): 30 meq via ORAL
  Filled 2023-12-02 (×2): qty 1

## 2023-12-02 MED ORDER — ASPIRIN 81 MG PO CHEW
81.0000 mg | CHEWABLE_TABLET | ORAL | Status: AC
  Administered 2023-12-03: 81 mg via ORAL
  Filled 2023-12-02: qty 1

## 2023-12-02 MED ORDER — ATORVASTATIN CALCIUM 10 MG PO TABS
20.0000 mg | ORAL_TABLET | Freq: Every day | ORAL | Status: DC
  Administered 2023-12-02 – 2023-12-03 (×2): 20 mg via ORAL
  Filled 2023-12-02 (×2): qty 2

## 2023-12-02 NOTE — Progress Notes (Signed)
 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-discharged on aspirin  Plavix  21 days with aspirin  alone-not felt that imaging consistent with symptoms Found to have severe aortic stenosis 2016 and worsened on echo on recent admission Followed with cardiology 8/1 felt to be euvolemic and discussed repeat echo in 6 months and a 30-day monitor was ordered which she wore for only 7 days  Chronology  11/4  presented with DOE A-fib RVR pleural effusion on x-ray treated with metoprolol x 1 as well as Lasix 20 IV    Pertinent imaging/studies till date  11/4 CXR small right pleural effusion blunting of right costophrenic angle   Assessment  & Plan :    Probable acute heart heart failure Pleural effusion ?  Severe aortic stenosis Await echocardiogram that has been ordered by cardiology she does have an impressive murmur at bedside She is an active lady and participates in all activities at her assisted living Diuresis with Lasix 40 IV daily-ordered--- hold further beta-blockade for right now as trying to diurese and may be reinitiated Strict I/O weight to be monitored with standing weights--- her weight however is the same as it was at cardiology office visit 08/28/2023 Defer to cardiology further planning A-fib reported on admission Received metoprolol on admission-here recc bigeminy more so than SVT-defer to cardiology may need repeat 30-day event monitor Cryptogenic TIA Did not wear event monitor-outpatient follow-up with neurology Continue aspirin  81 mg, atorvastatin  20 Blood pressure is borderline and moderately controlled Very mild cognitive impairment Not  apparent  Data Reviewed today:  Sodium 141 potassium 3.5 BUN/creatinine 13/0.8 WBC 5.4 hemoglobin 11.3 platelet 227 Echo pending  DVT prophylaxis: SCD  Status is: Observation The patient will require care spanning > 2 midnights and should be moved to inpatient because:   Requires echocardiogram and management of    Dispo/Global plan: Unclear at this time   Time 25   Subjective:   Awake alert coherent pleasant About to have echocardiogram No pain no fever no chills Tells me her main symptom is shortness of breath She has always had lower extremity swelling since her pregnancy many years ago Overall she feels about the same as she did when she came in    Objective + exam Vitals:   12/01/23 2310 12/02/23 0402 12/02/23 0628 12/02/23 0718  BP: 132/85 111/60  101/64  Pulse: 97 90  84  Resp: 20 15    Temp: 97.9 F (36.6 C) 98.2 F (36.8 C)  98 F (36.7 C)  TempSrc: Oral Oral  Oral  SpO2: 97% 95%  91%  Weight:   82.2 kg   Height:       Filed Weights   12/01/23 1611 12/02/23 0628  Weight: 80.3 kg 82.2 kg     Examination: EOMI NCAT no focal deficit icterus no pallor Chest is clear without wheezing S1-S2 holosystolic murmur 4/6 best heard RUSB Abdomen soft No rebound no guarding No lower extremity edema Psych is euthymic coherent  Scheduled Meds:  aspirin  EC  81 mg Oral Daily   atorvastatin   20 mg Oral Daily   furosemide  40 mg Intravenous Daily   Continuous Infusions: acetaminophen , melatonin, polyethylene  glycol, prochlorperazine  Jai-Gurmukh Mrk Buzby, MD  [facility]

## 2023-12-02 NOTE — Progress Notes (Signed)
 PHARMACY - ANTICOAGULATION CONSULT NOTE  Pharmacy Consult for Heparin Indication: atrial fibrillation  No Known Allergies  Patient Measurements: Height: 5' 5 (165.1 cm) Weight: 82.2 kg (181 lb 3.5 oz) IBW/kg (Calculated) : 57 HEPARIN DW (KG): 74  Vital Signs: Temp: 98 F (36.7 C) (11/05 1938) Temp Source: Oral (11/05 1938) BP: 107/66 (11/05 1938) Pulse Rate: 96 (11/05 1938)  Labs: Recent Labs    12/01/23 1618 12/02/23 0413 12/02/23 1857  HGB 11.9* 11.3*  --   HCT 36.8 35.0*  --   PLT 241 227  --   APTT  --   --  38*  HEPARINUNFRC  --   --  0.84*  CREATININE 0.77 0.87  --     Estimated Creatinine Clearance: 56.5 mL/min (by C-G formula based on SCr of 0.87 mg/dL).   Medical History: Past Medical History:  Diagnosis Date   Dysrhythmia    Assessment: 78 yr old female to begin IV heparin for new onset atrial fibrillation.  Eliquis 5 mg BID x 1 begun on 11/4 and 1 dose was given at 1821.  Will check aPTTs and heparin levels for correlation, as heparin levels can be falsely elevated due to Eliquis.  aPTT sub-therapeutic; no issue with heparin infusion per RN.  Goal of Therapy:  Heparin level 0.3-0.7 units/ml Monitor platelets by anticoagulation protocol: Yes   Plan:  Increase heparin drip to 1350 units/hr F/U AM labs  Queenie Aufiero D. Lendell, PharmD, BCPS, BCCCP 12/02/2023, 7:56 PM  Daily CBC

## 2023-12-02 NOTE — Consult Note (Addendum)
 Cardiology Consultation  Patient ID: Julia Bowman MRN: [id number]; DOB: 1945/05/17  Admit date: 12/01/2023 Date of Consult: 12/02/2023  PCP:  Feliciano Devoria LABOR, MD   [facility] Providers Cardiologist:  Annabella Scarce, MD     Patient Profile: Julia Bowman is a 78 y.o. female with a hx of severe aortic stenosis, hyperlipidemia, family history of CAD, and TIA/CVA  who is being seen 12/02/2023 for the evaluation of severe AS, new onset A-Fib, CHF exacerbation at the request of Dr. Shona.  History of Present Illness: [name] has past medical history as stated above. She presented [facility] ED on 12/01/2023 complaining of exertional dyspnea, chest tightness, orthopnea x 4 days. She reports that it had gotten significantly worse, escalating to the point where she was unable to walk across the room without becoming short of breath.   Relevant workup in the ED/since admission includes: troponin 43 ? 45, proBNP 7,643, CMP unremarkable, CBC showed mild anemia, magnesium normal. CXR showed small right pleural effusion.  Echocardiogram from July 2025 showed: LVEF 50-55%, global hypokinesis, mild LVH, mildly reduced RV systolic function, severe MAC, mild to moderate AR, severe AS, aortic valve area, 0.81 cm . mean gradient 39.7 mmHg. Vmax 4.25 m/ s, DI 0.23.  She wore a cardiac monitor that was intended to be worn for 30 days but was only worn for 7 days showed primarily sinus rhythm, no A-Fib. The monitor was ordered in the setting of history of CVA.   When she last saw [name], NP in August 2025 she was asymptomatic at this time and euvolemic so they made the decision to repeat an echo in 5-6 months to monitor. At this appointment her weight was 181.4 lb.   After speaking with the patient, she agrees with the history as stated above. She has two friends present in the room with her who help with history. They tell me that she did have very good urine output with IV  Lasix 20 mg x 1 dose in the ER as well as when she was on the floor. The patient tells me that she believes the symptoms first began just a few days ago and have progressed rather rapidly. She has been primarily resting in bed since she has been admitted, so she reports not feeling short of breath.   Past Medical History:  Diagnosis Date   Dysrhythmia    Past Surgical History:  Procedure Laterality Date   ABDOMINAL HYSTERECTOMY     BREAST BIOPSY     COLONOSCOPY WITH PROPOFOL  N/A 01/17/2015   Procedure: COLONOSCOPY WITH PROPOFOL ;  Surgeon: Elsie Cree, MD;  Location: [facility] ENDOSCOPY;  Service: Endoscopy;  Laterality: N/A;   HAND SURGERY     STOMACH SURGERY     TONSILLECTOMY      Home Medications:  Prior to Admission medications   Medication Sig Start Date End Date Taking? Authorizing Provider  aspirin  EC 81 MG tablet Take 1 tablet (81 mg total) by mouth daily. Swallow whole. 08/14/23   Gherghe, Costin M, MD  atorvastatin  (LIPITOR) 20 MG tablet Take 1 tablet (20 mg total) by mouth daily. 08/28/23   Braver Damien BROCKS, NP  cyanocobalamin  (,VITAMIN B-12,) 1000 MCG/ML injection Inject 1,000 mcg into the skin every 30 (thirty) days.  10/04/14   [provider]  fluticasone  (FLONASE ) 50 MCG/ACT nasal spray Place 2 sprays into both nostrils daily. 2 sprays each nostril at night 11/07/20   Ethyl Lonni BRAVO, MD  hydroxypropyl methylcellulose /  hypromellose (ISOPTO TEARS / GONIOVISC) 2.5 % ophthalmic solution Place 1 drop into both eyes 4 (four) times daily as needed for dry eyes.    [provider]  Multiple Vitamin (MULTIVITAMIN WITH MINERALS) TABS tablet Take 1 tablet by mouth daily.    [provider]  triamcinolone  (NASACORT ) 55 MCG/ACT AERO nasal inhaler Place 2 sprays into the nose daily. 2 sprays each nostril at night 11/13/20   Ethyl Lonni BRAVO, MD   Scheduled Meds:  aspirin  EC  81 mg Oral Daily   atorvastatin   20 mg Oral Daily   potassium chloride  30 mEq Oral  BID   Continuous Infusions:  heparin 1,050 Units/hr (12/02/23 1107)   PRN Meds: acetaminophen , melatonin, polyethylene glycol, prochlorperazine  Allergies:   No Known Allergies  Social History:   Social History   Socioeconomic History   Marital status: Married    Spouse name: Not on file   Number of children: Not on file   Years of education: Not on file   Highest education level: Not on file  Occupational History   Not on file  Tobacco Use   Smoking status: Never   Smokeless tobacco: Not on file  Substance and Sexual Activity   Alcohol use: No   Drug use: No   Sexual activity: Not on file  Other Topics Concern   Not on file  Social History Narrative   Not on file   Social Drivers of Health   Financial Resource Strain: Not on file  Food Insecurity: No Food Insecurity (12/01/2023)   Hunger Vital Sign    Worried About Running Out of Food in the Last Year: Never true    Ran Out of Food in the Last Year: Never true  Transportation Needs: No Transportation Needs (12/01/2023)   PRAPARE - Administrator, Civil Service (Medical): No    Lack of Transportation (Non-Medical): No  Physical Activity: Not on file  Stress: Not on file  Social Connections: Moderately Integrated (12/01/2023)   Social Connection and Isolation Panel    Frequency of Communication with Friends and Family: More than three times a week    Frequency of Social Gatherings with Friends and Family: More than three times a week    Attends Religious Services: More than 4 times per year    Active Member of [employer] or Organizations: Yes    Attends Banker Meetings: More than 4 times per year    Marital Status: Widowed  Intimate Partner Violence: Not At Risk (12/01/2023)   Humiliation, Afraid, Rape, and Kick questionnaire    Fear of Current or Ex-Partner: No    Emotionally Abused: No    Physically Abused: No    Sexually Abused: No    Family History:   History reviewed. No pertinent  family history.   ROS:  Please see the history of present illness.  All other ROS reviewed and negative.     Physical Exam/Data: Vitals:   12/02/23 0402 12/02/23 0628 12/02/23 0718 12/02/23 1108  BP: 111/60  101/64 103/73  Pulse: 90  84 93  Resp: 15  16 20   Temp: 98.2 F (36.8 C)  98 F (36.7 C) 97.9 F (36.6 C)  TempSrc: Oral  Oral Oral  SpO2: 95%  91% 93%  Weight:  82.2 kg    Height:       No intake or output data in the 24 hours ending 12/02/23 1321    12/02/2023    6:28 AM 12/01/2023  4:11 PM 08/28/2023    1:38 PM  Last 3 Weights  Weight (lbs) 181 lb 3.5 oz 177 lb 181 lb 6.4 oz  Weight (kg) 82.2 kg 80.287 kg 82.283 kg     Body mass index is 30.16 kg/m.   General:  in no acute distress, on room air HEENT: normal Neck: no JVD Vascular: Distal pulses 2+ bilaterally Cardiac:  diminished S2, 4/6 SEM at RUSB Lungs:  clear to auscultation bilaterally Abd: soft, nontender, no hepatomegaly  Ext: no edema Musculoskeletal:  No deformities, BUE and BLE strength normal and equal Skin: warm and dry  Neuro:   no focal abnormalities noted Psych:  Normal affect   EKG:  The EKG was personally reviewed and demonstrates:  A-Fib with HR 93  Telemetry:  Telemetry was personally reviewed and demonstrates:  sinus with HR 90s, frequent ectopy   Relevant CV Studies:  Echocardiogram, 12/02/2023 Ordered, pending results  Echocardiogram, 08/13/2023 Left ventricular ejection fraction, by estimation, is 50 to 55% . Left ventricular ejection fraction by 3D volume is 52 % . The left ventricle has low normal function. The left ventricle demonstrates global hypokinesis. There is mild concentric left ventricular hypertrophy. Left ventricular diastolic function could not be evaluated. The average left ventricular global longitudinal strain is -16.1 % . The global longitudinal strain is abnormal.  Right ventricular systolic function is mildly reduced. The right ventricular size is normal.  The  mitral valve is abnormal. No evidence of mitral valve regurgitation. No evidence of mitral stenosis. Severe mitral annular calcification.  The aortic valve is calcified. There is moderate calcification of the aortic valve. There is severe thickening of the aortic valve. Aortic valve regurgitation is mild to moderate. Severe aortic valve stenosis. Aortic valve area, by VTI measures 0. 81 cm . Aortic valve mean gradient measures 39. 7 mmHg. Aortic valve Vmax measures 4. 25 m/ s.  Cardiac monitor, 10/06/2023 30 Day Event Monitor   7 days of data available for analysis   Quality: Fair.  Baseline artifact. Predominant rhythm: sinus rhythm Average heart rate: 87 bpm Max heart rate: 127 bpm Min heart rate: 53 bpm Pauses >2.5 seconds: none   3% PVCs 2% PACs  Laboratory Data: High Sensitivity Troponin:  No results for input(s): TROPONINIHS in the last 720 hours.   Chemistry Recent Labs  Lab 12/01/23 1618 12/02/23 0413  NA 141 141  K 4.1 3.5  CL 106 104  CO2 22 25  GLUCOSE 102* 100*  BUN 12 13  CREATININE 0.77 0.87  CALCIUM  9.4 8.8*  MG 2.2 2.0  GFRNONAA >60 >60  ANIONGAP 13 12    Recent Labs  Lab 12/01/23 1618  PROT 7.2  ALBUMIN 4.1  AST 28  ALT 15  ALKPHOS 87  BILITOT 0.8   Lipids No results for input(s): CHOL, TRIG, HDL, LABVLDL, LDLCALC, CHOLHDL in the last 168 hours.  Hematology Recent Labs  Lab 12/01/23 1618 12/02/23 0413  WBC 5.3 5.4  RBC 4.11 3.90  HGB 11.9* 11.3*  HCT 36.8 35.0*  MCV 89.5 89.7  MCH 29.0 29.0  MCHC 32.3 32.3  RDW 14.0 13.9  PLT 241 227   Thyroid  No results for input(s): TSH, FREET4 in the last 168 hours.  BNP Recent Labs  Lab 12/01/23 1618  PROBNP 7,643.0*    DDimer No results for input(s): DDIMER in the last 168 hours.  Radiology/Studies:  DG Chest 2 View Result Date: 12/01/2023 EXAM: 2 VIEW(S) XRAY OF THE CHEST 12/01/2023 04:38:45 PM COMPARISON: 08/12/2023  CLINICAL HISTORY: sob sob FINDINGS: LUNGS AND  PLEURA: No focal pulmonary opacity. No pulmonary edema. Blunting of the right costophrenic angle could reflect small pleural effusion. No pneumothorax. HEART AND MEDIASTINUM: No acute abnormality of the cardiac and mediastinal silhouettes. BONES AND SOFT TISSUES: No acute osseous abnormality. IMPRESSION: 1. Small right pleural effusion suspected based on blunting of the right costophrenic angle. Electronically signed by: Franky Crease MD 12/01/2023 05:18 PM EST RP Workstation: [id number]   Assessment and Plan:  Severe aortic stenosis  Noted to have severe AS on echo from July 2025 Valve area, 0.81 cm, mean gradient 39.7 mmHg. Vmax 4.25 m/ s, DI 0.23 Asymptomatic at the time, planned for repeat echo in 5-6 months  Patient presented with chest tightness, exertional dyspnea, orthopnea Reports no significant symptoms since admission, as she has been resting in bed Will be cautious with IV diuresis with severe AS, she appears euvolemic on exam today so will hold of on diuresis for now   Acute on likely chronic HFpEF Echo July 2025: LVEF 50-55%, mildly reduced RV systolic function  proBNP 7,643 Troponin 43 ? 45 CXR showed small right pleural effusion Given IV Lasix 20 mg x 1 dose in ER  No I/O's charted, but patient reports good urine output   Renal function stable  Dry weight: 181.4 lb, weight today 181.2 lbs  Updated echocardiogram ordered Continue strict I&O's, daily weights  A-Fib vs sinus with frequent ectopy No prior history of A-Fib Recent monitor, worn for 7 days in September 2025 showed no A-Fib Reported to have A-Fib at [facility] Given Lopressor 12.5 mg x 1 dose in ER Currently in sinus with HR 90s, frequent ectopy  Starting on Eliquis, held and started IV heparin with thoughts she may undergo R/LHC this admission -- will discuss with MD, if confirms there is no convincing A-Fib will stop anticoagulation   Frequent PVCs Noted to have frequent PVCs on telemetry Monitor from  Sept. 2025 noted 3% PVCs, 2% PACs K was 3.5 -- repleted Mag 2.0  Patient asymptomatic  Continue to monitor on telemetry   Hyperlipidemia  Home meds: Lipitor 20 mg daily 10/12/2023: HDL 51; LDL Chol Calc (NIH) 68 12/01/2023: ALT 15  Continue home statin   Per primary History of recent TIA  Risk Assessment/Risk Scores:      New York  Heart Association (NYHA) Functional Class NYHA Class III  CHA2DS2-VASc Score = 6   This indicates a 9.7% annual risk of stroke. The patient's score is based upon: CHF History: 1 HTN History: 0 Diabetes History: 0 Stroke History: 2 Vascular Disease History: 0 Age Score: 2 Gender Score: 1       For questions or updates, please contact [facility] Please consult [email or web address] for contact info under   Signed, Waddell DELENA Donath, PA-C  12/02/2023 1:21 PM  Patient seen, examined. Available data reviewed. Agree with findings, assessment, and plan as outlined by Waddell Donath, PA-C.  On my exam, the patient is alert, oriented, in no distress.  She has 2 friends at bedside.  HEENT is normal, JVP is normal, carotid upstrokes are normal with bilateral bruits, lungs are clear bilaterally, heart is regular rate and rhythm with a 3/6 crescendo decrescendo murmur at the right upper sternal border, abdomen is soft and nontender, extremities have no edema, skin is warm and dry with no rash, abdomen is soft and nontender with no masses.  The patient reports to me a 1 week history of exertional dyspnea, chest tightness, and  orthopnea.  Her symptoms have improved significantly since IV diuretic therapy was administered in the emergency room here.  She is now resting comfortably in reports that she is asymptomatic.  However, she has not been out of bed at all.  States that her breathing is normal at rest and she has had no resting chest discomfort.  The patient was diagnosed with severe aortic stenosis in July 2025 when an echocardiogram showed LVEF of 50 to  55%, mildly reduced RV function, severe mitral annular calcium , mild to moderate aortic regurgitation, and severe aortic stenosis with a mean gradient of 40 mmHg, V-max of 4.2 m/s, dimensionless index of 0.23, and aortic valve area of 0.8.  The patient was asymptomatic at that time and the plan was to follow her up closely in about 6 months.  She now has developed symptoms of worsening aortic stenosis and heart failure.  Since her outpatient cardiology evaluation, she was felt to have had a mild TIA and was hospitalized in July 2025.  I have reviewed the natural history of aortic stenosis with the patient and her close friends who are present today. We have discussed the limitations of medical therapy and the poor prognosis associated with symptomatic aortic stenosis. We have reviewed potential treatment options, including palliative medical therapy, conventional surgical aortic valve replacement, and transcatheter aortic valve replacement. We discussed treatment options in the context of the patient's specific comorbid medical conditions.  The patient has clear indication for aortic valve intervention with severe aortic stenosis, LV dysfunction, now with NYHA functional class III symptoms of acute systolic heart failure.  I discussed the fact that at age 55 with mild cognitive dysfunction, TAVR would be the preferred treatment for her, as long as her anatomy is appropriate.  I have recommended right and left heart catheterization for further evaluation. I have reviewed the risks, indications, and alternatives to cardiac catheterization, possible angioplasty, and stenting with the patient. Risks include but are not limited to bleeding, infection, vascular injury, stroke, myocardial infection, arrhythmia, kidney injury, radiation-related injury in the case of prolonged fluoroscopy use, emergency cardiac surgery, and death. The patient understands the risks of serious complication is 1-2 in 1000 with diagnostic  cardiac cath and 1-2% or less with angioplasty/stenting.  Once her cardiac catheterization is completed, the remainder of her workup could probably be done in the outpatient setting.  She will need to undergo a gated CTA of the heart and a CTA of the chest, abdomen, and pelvis.  She will then need to undergo formal cardiac surgical consultation as part of a multidisciplinary approach to her care.  All of her questions were answered today.  I discussed the TAVR procedure with her pending further evaluation.  Will follow-up tomorrow after her heart catheterization.  Orders are placed and informed consent is obtained.  Informed Consent   Shared Decision Making/Informed Consent The risks [stroke (1 in 1000), death (1 in 1000), kidney failure [usually temporary] (1 in 500), bleeding (1 in 200), allergic reaction [possibly serious] (1 in 200)], benefits (diagnostic support and management of coronary artery disease) and alternatives of a cardiac catheterization were discussed in detail with Ms. Schrum and she is willing to proceed.      Ozell Fell, M.D. 12/02/2023 3:38 PM

## 2023-12-02 NOTE — Care Management Obs Status (Signed)
 MEDICARE OBSERVATION STATUS NOTIFICATION   Patient Details  Name: Julia Bowman MRN: [id number] Date of Birth: 27-Nov-1945   Medicare Observation Status Notification Given:  Yes    Vonzell Arrie Sharps 12/02/2023, 11:04 AM

## 2023-12-02 NOTE — H&P (View-Only) (Signed)
 Cardiology Consultation  Patient ID: BRYCE KIMBLE MRN: [id number]; DOB: 1945/05/17  Admit date: 12/01/2023 Date of Consult: 12/02/2023  PCP:  Feliciano Devoria LABOR, MD   [facility] Providers Cardiologist:  Annabella Scarce, MD     Patient Profile: Julia Bowman is a 78 y.o. female with a hx of severe aortic stenosis, hyperlipidemia, family history of CAD, and TIA/CVA  who is being seen 12/02/2023 for the evaluation of severe AS, new onset A-Fib, CHF exacerbation at the request of Dr. Shona.  History of Present Illness: Julia Bowman has past medical history as stated above. She presented [facility] ED on 12/01/2023 complaining of exertional dyspnea, chest tightness, orthopnea x 4 days. She reports that it had gotten significantly worse, escalating to the point where she was unable to walk across the room without becoming short of breath.   Relevant workup in the ED/since admission includes: troponin 43 ? 45, proBNP 7,643, CMP unremarkable, CBC showed mild anemia, magnesium normal. CXR showed small right pleural effusion.  Echocardiogram from July 2025 showed: LVEF 50-55%, global hypokinesis, mild LVH, mildly reduced RV systolic function, severe MAC, mild to moderate AR, severe AS, aortic valve area, 0.81 cm . mean gradient 39.7 mmHg. Vmax 4.25 m/ s, DI 0.23.  She wore a cardiac monitor that was intended to be worn for 30 days but was only worn for 7 days showed primarily sinus rhythm, no A-Fib. The monitor was ordered in the setting of history of CVA.   When she last saw [name], NP in August 2025 she was asymptomatic at this time and euvolemic so they made the decision to repeat an echo in 5-6 months to monitor. At this appointment her weight was 181.4 lb.   After speaking with the patient, she agrees with the history as stated above. She has two friends present in the room with her who help with history. They tell me that she did have very good urine output with IV  Lasix 20 mg x 1 dose in the ER as well as when she was on the floor. The patient tells me that she believes the symptoms first began just a few days ago and have progressed rather rapidly. She has been primarily resting in bed since she has been admitted, so she reports not feeling short of breath.   Past Medical History:  Diagnosis Date   Dysrhythmia    Past Surgical History:  Procedure Laterality Date   ABDOMINAL HYSTERECTOMY     BREAST BIOPSY     COLONOSCOPY WITH PROPOFOL  N/A 01/17/2015   Procedure: COLONOSCOPY WITH PROPOFOL ;  Surgeon: Elsie Cree, MD;  Location: [facility] ENDOSCOPY;  Service: Endoscopy;  Laterality: N/A;   HAND SURGERY     STOMACH SURGERY     TONSILLECTOMY      Home Medications:  Prior to Admission medications   Medication Sig Start Date End Date Taking? Authorizing Provider  aspirin  EC 81 MG tablet Take 1 tablet (81 mg total) by mouth daily. Swallow whole. 08/14/23   Gherghe, Costin M, MD  atorvastatin  (LIPITOR) 20 MG tablet Take 1 tablet (20 mg total) by mouth daily. 08/28/23   Braver Damien BROCKS, NP  cyanocobalamin  (,VITAMIN B-12,) 1000 MCG/ML injection Inject 1,000 mcg into the skin every 30 (thirty) days.  10/04/14   [provider]  fluticasone  (FLONASE ) 50 MCG/ACT nasal spray Place 2 sprays into both nostrils daily. 2 sprays each nostril at night 11/07/20   Ethyl Lonni BRAVO, MD  hydroxypropyl methylcellulose /  hypromellose (ISOPTO TEARS / GONIOVISC) 2.5 % ophthalmic solution Place 1 drop into both eyes 4 (four) times daily as needed for dry eyes.    [provider]  Multiple Vitamin (MULTIVITAMIN WITH MINERALS) TABS tablet Take 1 tablet by mouth daily.    [provider]  triamcinolone  (NASACORT ) 55 MCG/ACT AERO nasal inhaler Place 2 sprays into the nose daily. 2 sprays each nostril at night 11/13/20   Ethyl Lonni BRAVO, MD   Scheduled Meds:  aspirin  EC  81 mg Oral Daily   atorvastatin   20 mg Oral Daily   potassium chloride  30 mEq Oral  BID   Continuous Infusions:  heparin 1,050 Units/hr (12/02/23 1107)   PRN Meds: acetaminophen , melatonin, polyethylene glycol, prochlorperazine  Allergies:   No Known Allergies  Social History:   Social History   Socioeconomic History   Marital status: Married    Spouse name: Not on file   Number of children: Not on file   Years of education: Not on file   Highest education level: Not on file  Occupational History   Not on file  Tobacco Use   Smoking status: Never   Smokeless tobacco: Not on file  Substance and Sexual Activity   Alcohol use: No   Drug use: No   Sexual activity: Not on file  Other Topics Concern   Not on file  Social History Narrative   Not on file   Social Drivers of Health   Financial Resource Strain: Not on file  Food Insecurity: No Food Insecurity (12/01/2023)   Hunger Vital Sign    Worried About Running Out of Food in the Last Year: Never true    Ran Out of Food in the Last Year: Never true  Transportation Needs: No Transportation Needs (12/01/2023)   PRAPARE - Administrator, Civil Service (Medical): No    Lack of Transportation (Non-Medical): No  Physical Activity: Not on file  Stress: Not on file  Social Connections: Moderately Integrated (12/01/2023)   Social Connection and Isolation Panel    Frequency of Communication with Friends and Family: More than three times a week    Frequency of Social Gatherings with Friends and Family: More than three times a week    Attends Religious Services: More than 4 times per year    Active Member of [employer] or Organizations: Yes    Attends Banker Meetings: More than 4 times per year    Marital Status: Widowed  Intimate Partner Violence: Not At Risk (12/01/2023)   Humiliation, Afraid, Rape, and Kick questionnaire    Fear of Current or Ex-Partner: No    Emotionally Abused: No    Physically Abused: No    Sexually Abused: No    Family History:   History reviewed. No pertinent  family history.   ROS:  Please see the history of present illness.  All other ROS reviewed and negative.     Physical Exam/Data: Vitals:   12/02/23 0402 12/02/23 0628 12/02/23 0718 12/02/23 1108  BP: 111/60  101/64 103/73  Pulse: 90  84 93  Resp: 15  16 20   Temp: 98.2 F (36.8 C)  98 F (36.7 C) 97.9 F (36.6 C)  TempSrc: Oral  Oral Oral  SpO2: 95%  91% 93%  Weight:  82.2 kg    Height:       No intake or output data in the 24 hours ending 12/02/23 1321    12/02/2023    6:28 AM 12/01/2023  4:11 PM 08/28/2023    1:38 PM  Last 3 Weights  Weight (lbs) 181 lb 3.5 oz 177 lb 181 lb 6.4 oz  Weight (kg) 82.2 kg 80.287 kg 82.283 kg     Body mass index is 30.16 kg/m.   General:  in no acute distress, on room air HEENT: normal Neck: no JVD Vascular: Distal pulses 2+ bilaterally Cardiac:  diminished S2, 4/6 SEM at RUSB Lungs:  clear to auscultation bilaterally Abd: soft, nontender, no hepatomegaly  Ext: no edema Musculoskeletal:  No deformities, BUE and BLE strength normal and equal Skin: warm and dry  Neuro:   no focal abnormalities noted Psych:  Normal affect   EKG:  The EKG was personally reviewed and demonstrates:  A-Fib with HR 93  Telemetry:  Telemetry was personally reviewed and demonstrates:  sinus with HR 90s, frequent ectopy   Relevant CV Studies:  Echocardiogram, 12/02/2023 Ordered, pending results  Echocardiogram, 08/13/2023 Left ventricular ejection fraction, by estimation, is 50 to 55% . Left ventricular ejection fraction by 3D volume is 52 % . The left ventricle has low normal function. The left ventricle demonstrates global hypokinesis. There is mild concentric left ventricular hypertrophy. Left ventricular diastolic function could not be evaluated. The average left ventricular global longitudinal strain is -16.1 % . The global longitudinal strain is abnormal.  Right ventricular systolic function is mildly reduced. The right ventricular size is normal.  The  mitral valve is abnormal. No evidence of mitral valve regurgitation. No evidence of mitral stenosis. Severe mitral annular calcification.  The aortic valve is calcified. There is moderate calcification of the aortic valve. There is severe thickening of the aortic valve. Aortic valve regurgitation is mild to moderate. Severe aortic valve stenosis. Aortic valve area, by VTI measures 0. 81 cm . Aortic valve mean gradient measures 39. 7 mmHg. Aortic valve Vmax measures 4. 25 m/ s.  Cardiac monitor, 10/06/2023 30 Day Event Monitor   7 days of data available for analysis   Quality: Fair.  Baseline artifact. Predominant rhythm: sinus rhythm Average heart rate: 87 bpm Max heart rate: 127 bpm Min heart rate: 53 bpm Pauses >2.5 seconds: none   3% PVCs 2% PACs  Laboratory Data: High Sensitivity Troponin:  No results for input(s): TROPONINIHS in the last 720 hours.   Chemistry Recent Labs  Lab 12/01/23 1618 12/02/23 0413  NA 141 141  K 4.1 3.5  CL 106 104  CO2 22 25  GLUCOSE 102* 100*  BUN 12 13  CREATININE 0.77 0.87  CALCIUM  9.4 8.8*  MG 2.2 2.0  GFRNONAA >60 >60  ANIONGAP 13 12    Recent Labs  Lab 12/01/23 1618  PROT 7.2  ALBUMIN 4.1  AST 28  ALT 15  ALKPHOS 87  BILITOT 0.8   Lipids No results for input(s): CHOL, TRIG, HDL, LABVLDL, LDLCALC, CHOLHDL in the last 168 hours.  Hematology Recent Labs  Lab 12/01/23 1618 12/02/23 0413  WBC 5.3 5.4  RBC 4.11 3.90  HGB 11.9* 11.3*  HCT 36.8 35.0*  MCV 89.5 89.7  MCH 29.0 29.0  MCHC 32.3 32.3  RDW 14.0 13.9  PLT 241 227   Thyroid  No results for input(s): TSH, FREET4 in the last 168 hours.  BNP Recent Labs  Lab 12/01/23 1618  PROBNP 7,643.0*    DDimer No results for input(s): DDIMER in the last 168 hours.  Radiology/Studies:  DG Chest 2 View Result Date: 12/01/2023 EXAM: 2 VIEW(S) XRAY OF THE CHEST 12/01/2023 04:38:45 PM COMPARISON: 08/12/2023  CLINICAL HISTORY: sob sob FINDINGS: LUNGS AND  PLEURA: No focal pulmonary opacity. No pulmonary edema. Blunting of the right costophrenic angle could reflect small pleural effusion. No pneumothorax. HEART AND MEDIASTINUM: No acute abnormality of the cardiac and mediastinal silhouettes. BONES AND SOFT TISSUES: No acute osseous abnormality. IMPRESSION: 1. Small right pleural effusion suspected based on blunting of the right costophrenic angle. Electronically signed by: Franky Crease MD 12/01/2023 05:18 PM EST RP Workstation: [id number]   Assessment and Plan:  Severe aortic stenosis  Noted to have severe AS on echo from July 2025 Valve area, 0.81 cm, mean gradient 39.7 mmHg. Vmax 4.25 m/ s, DI 0.23 Asymptomatic at the time, planned for repeat echo in 5-6 months  Patient presented with chest tightness, exertional dyspnea, orthopnea Reports no significant symptoms since admission, as she has been resting in bed Will be cautious with IV diuresis with severe AS, she appears euvolemic on exam today so will hold of on diuresis for now   Acute on likely chronic HFpEF Echo July 2025: LVEF 50-55%, mildly reduced RV systolic function  proBNP 7,643 Troponin 43 ? 45 CXR showed small right pleural effusion Given IV Lasix 20 mg x 1 dose in ER  No I/O's charted, but patient reports good urine output   Renal function stable  Dry weight: 181.4 lb, weight today 181.2 lbs  Updated echocardiogram ordered Continue strict I&O's, daily weights  A-Fib vs sinus with frequent ectopy No prior history of A-Fib Recent monitor, worn for 7 days in September 2025 showed no A-Fib Reported to have A-Fib at [facility] Given Lopressor 12.5 mg x 1 dose in ER Currently in sinus with HR 90s, frequent ectopy  Starting on Eliquis, held and started IV heparin with thoughts she may undergo R/LHC this admission -- will discuss with MD, if confirms there is no convincing A-Fib will stop anticoagulation   Frequent PVCs Noted to have frequent PVCs on telemetry Monitor from  Sept. 2025 noted 3% PVCs, 2% PACs K was 3.5 -- repleted Mag 2.0  Patient asymptomatic  Continue to monitor on telemetry   Hyperlipidemia  Home meds: Lipitor 20 mg daily 10/12/2023: HDL 51; LDL Chol Calc (NIH) 68 12/01/2023: ALT 15  Continue home statin   Per primary History of recent TIA  Risk Assessment/Risk Scores:      New York  Heart Association (NYHA) Functional Class NYHA Class III  CHA2DS2-VASc Score = 6   This indicates a 9.7% annual risk of stroke. The patient's score is based upon: CHF History: 1 HTN History: 0 Diabetes History: 0 Stroke History: 2 Vascular Disease History: 0 Age Score: 2 Gender Score: 1       For questions or updates, please contact [facility] Please consult [email or web address] for contact info under   Signed, Julia DELENA Donath, PA-C  12/02/2023 1:21 PM  Patient seen, examined. Available data reviewed. Agree with findings, assessment, and plan as outlined by Julia Donath, PA-C.  On my exam, the patient is alert, oriented, in no distress.  She has 2 friends at bedside.  HEENT is normal, JVP is normal, carotid upstrokes are normal with bilateral bruits, lungs are clear bilaterally, heart is regular rate and rhythm with a 3/6 crescendo decrescendo murmur at the right upper sternal border, abdomen is soft and nontender, extremities have no edema, skin is warm and dry with no rash, abdomen is soft and nontender with no masses.  The patient reports to me a 1 week history of exertional dyspnea, chest tightness, and  orthopnea.  Her symptoms have improved significantly since IV diuretic therapy was administered in the emergency room here.  She is now resting comfortably in reports that she is asymptomatic.  However, she has not been out of bed at all.  States that her breathing is normal at rest and she has had no resting chest discomfort.  The patient was diagnosed with severe aortic stenosis in July 2025 when an echocardiogram showed LVEF of 50 to  55%, mildly reduced RV function, severe mitral annular calcium , mild to moderate aortic regurgitation, and severe aortic stenosis with a mean gradient of 40 mmHg, V-max of 4.2 m/s, dimensionless index of 0.23, and aortic valve area of 0.8.  The patient was asymptomatic at that time and the plan was to follow her up closely in about 6 months.  She now has developed symptoms of worsening aortic stenosis and heart failure.  Since her outpatient cardiology evaluation, she was felt to have had a mild TIA and was hospitalized in July 2025.  I have reviewed the natural history of aortic stenosis with the patient and her close friends who are present today. We have discussed the limitations of medical therapy and the poor prognosis associated with symptomatic aortic stenosis. We have reviewed potential treatment options, including palliative medical therapy, conventional surgical aortic valve replacement, and transcatheter aortic valve replacement. We discussed treatment options in the context of the patient's specific comorbid medical conditions.  The patient has clear indication for aortic valve intervention with severe aortic stenosis, LV dysfunction, now with NYHA functional class III symptoms of acute systolic heart failure.  I discussed the fact that at age 55 with mild cognitive dysfunction, TAVR would be the preferred treatment for her, as long as her anatomy is appropriate.  I have recommended right and left heart catheterization for further evaluation. I have reviewed the risks, indications, and alternatives to cardiac catheterization, possible angioplasty, and stenting with the patient. Risks include but are not limited to bleeding, infection, vascular injury, stroke, myocardial infection, arrhythmia, kidney injury, radiation-related injury in the case of prolonged fluoroscopy use, emergency cardiac surgery, and death. The patient understands the risks of serious complication is 1-2 in 1000 with diagnostic  cardiac cath and 1-2% or less with angioplasty/stenting.  Once her cardiac catheterization is completed, the remainder of her workup could probably be done in the outpatient setting.  She will need to undergo a gated CTA of the heart and a CTA of the chest, abdomen, and pelvis.  She will then need to undergo formal cardiac surgical consultation as part of a multidisciplinary approach to her care.  All of her questions were answered today.  I discussed the TAVR procedure with her pending further evaluation.  Will follow-up tomorrow after her heart catheterization.  Orders are placed and informed consent is obtained.  Informed Consent   Shared Decision Making/Informed Consent The risks [stroke (1 in 1000), death (1 in 1000), kidney failure [usually temporary] (1 in 500), bleeding (1 in 200), allergic reaction [possibly serious] (1 in 200)], benefits (diagnostic support and management of coronary artery disease) and alternatives of a cardiac catheterization were discussed in detail with Julia Bowman and she is willing to proceed.      Ozell Fell, M.D. 12/02/2023 3:38 PM

## 2023-12-02 NOTE — Progress Notes (Signed)
 PHARMACY - ANTICOAGULATION CONSULT NOTE  Pharmacy Consult for Heparin Indication: atrial fibrillation  No Known Allergies  Patient Measurements: Height: 5' 5 (165.1 cm) Weight: 82.2 kg (181 lb 3.5 oz) IBW/kg (Calculated) : 57 HEPARIN DW (KG): 74  Vital Signs: Temp: 98 F (36.7 C) (11/05 0718) Temp Source: Oral (11/05 0718) BP: 101/64 (11/05 0718) Pulse Rate: 84 (11/05 0718)  Labs: Recent Labs    12/01/23 1618 12/02/23 0413  HGB 11.9* 11.3*  HCT 36.8 35.0*  PLT 241 227  CREATININE 0.77 0.87    Estimated Creatinine Clearance: 56.5 mL/min (by C-G formula based on SCr of 0.87 mg/dL).   Medical History: Past Medical History:  Diagnosis Date   Dysrhythmia    Assessment: 78 yr old female to begin IV heparin for new onset atrial fibrillation.  Eliquis 5 mg BID x 1 begun on 11/4 and 1 dose was given at 1821.  Cardiology consult in process, may need cardiac cath.  Only 1 dose of Eliquis given, but will check aPTTs and heparin levels for correlation, as heparin levels can be falsely elevated due to Eliquis.  Goal of Therapy:  Heparin level 0.3-0.7 units/ml Monitor platelets by anticoagulation protocol: Yes   Plan:  Begin heparin drip at 1050 units/hr aPTT and heparin level ~8 hrs after drip begins, then daily until correlating Daily CBC. New Eliquis 11/4 x 1 dose on hold.  Genaro Zebedee Calin, RPh 12/02/2023,10:33 AM

## 2023-12-03 ENCOUNTER — Other Ambulatory Visit: Payer: Self-pay

## 2023-12-03 ENCOUNTER — Encounter (HOSPITAL_COMMUNITY)
Admission: EM | Disposition: A | Discharge status: Home / Self Care | Payer: Self-pay | Source: Home / Self Care | Attending: Family Medicine

## 2023-12-03 ENCOUNTER — Encounter (HOSPITAL_COMMUNITY): Payer: Self-pay | Admitting: Internal Medicine

## 2023-12-03 DIAGNOSIS — I50813 Acute on chronic right heart failure: Secondary | ICD-10-CM | POA: Diagnosis not present

## 2023-12-03 DIAGNOSIS — I5033 Acute on chronic diastolic (congestive) heart failure: Secondary | ICD-10-CM

## 2023-12-03 DIAGNOSIS — I272 Pulmonary hypertension, unspecified: Secondary | ICD-10-CM | POA: Diagnosis not present

## 2023-12-03 DIAGNOSIS — I350 Nonrheumatic aortic (valve) stenosis: Secondary | ICD-10-CM | POA: Diagnosis not present

## 2023-12-03 HISTORY — PX: RIGHT/LEFT HEART CATH AND CORONARY ANGIOGRAPHY: CATH118266

## 2023-12-03 LAB — COMPREHENSIVE METABOLIC PANEL WITH GFR
ALT: 15 U/L (ref 0–44)
AST: 22 U/L (ref 15–41)
Albumin: 3.1 g/dL — ABNORMAL LOW (ref 3.5–5.0)
Alkaline Phosphatase: 61 U/L (ref 38–126)
Anion gap: 10 (ref 5–15)
BUN: 14 mg/dL (ref 8–23)
CO2: 22 mmol/L (ref 22–32)
Calcium: 8.7 mg/dL — ABNORMAL LOW (ref 8.9–10.3)
Chloride: 108 mmol/L (ref 98–111)
Creatinine, Ser: 0.82 mg/dL (ref 0.44–1.00)
GFR, Estimated: >60 mL/min (ref >60)
Glucose, Bld: 93 mg/dL (ref 70–99)
Potassium: 4.3 mmol/L (ref 3.5–5.1)
Sodium: 140 mmol/L (ref 135–145)
Total Bilirubin: 0.9 mg/dL (ref 0.0–1.2)
Total Protein: 6.2 g/dL — ABNORMAL LOW (ref 6.5–8.1)

## 2023-12-03 LAB — CBC
HCT: 34.1 % — ABNORMAL LOW (ref 36.0–46.0)
Hemoglobin: 11.1 g/dL — ABNORMAL LOW (ref 12.0–15.0)
MCH: 29.1 pg (ref 26.0–34.0)
MCHC: 32.6 g/dL (ref 30.0–36.0)
MCV: 89.5 fL (ref 80.0–100.0)
Platelets: 215 K/uL (ref 150–400)
RBC: 3.81 MIL/uL — ABNORMAL LOW (ref 3.87–5.11)
RDW: 14 % (ref 11.5–15.5)
WBC: 5.6 K/uL (ref 4.0–10.5)
nRBC: 0 % (ref 0.0–0.2)

## 2023-12-03 LAB — POCT I-STAT 7, (LYTES, BLD GAS, ICA,H+H)
Acid-base deficit: 3 mmol/L — ABNORMAL HIGH (ref 0.0–2.0)
Bicarbonate: 20.5 mmol/L (ref 20.0–28.0)
Calcium, Ion: 1.2 mmol/L (ref 1.15–1.40)
HCT: 33 % — ABNORMAL LOW (ref 36.0–46.0)
Hemoglobin: 11.2 g/dL — ABNORMAL LOW (ref 12.0–15.0)
O2 Saturation: 94 %
Potassium: 4 mmol/L (ref 3.5–5.1)
Sodium: 141 mmol/L (ref 135–145)
TCO2: 21 mmol/L — ABNORMAL LOW (ref 22–32)
pCO2 arterial: 30.6 mmHg — ABNORMAL LOW (ref 32–48)
pH, Arterial: 7.434 (ref 7.35–7.45)
pO2, Arterial: 68 mmHg — ABNORMAL LOW (ref 83–108)

## 2023-12-03 LAB — POCT I-STAT EG7
Acid-base deficit: 1 mmol/L (ref 0.0–2.0)
Bicarbonate: 23.3 mmol/L (ref 20.0–28.0)
Calcium, Ion: 1.18 mmol/L (ref 1.15–1.40)
HCT: 32 % — ABNORMAL LOW (ref 36.0–46.0)
Hemoglobin: 10.9 g/dL — ABNORMAL LOW (ref 12.0–15.0)
O2 Saturation: 62 %
Potassium: 3.9 mmol/L (ref 3.5–5.1)
Sodium: 141 mmol/L (ref 135–145)
TCO2: 24 mmol/L (ref 22–32)
pCO2, Ven: 38.1 mmHg — ABNORMAL LOW (ref 44–60)
pH, Ven: 7.394 (ref 7.25–7.43)
pO2, Ven: 32 mmHg (ref 32–45)

## 2023-12-03 LAB — HEPARIN LEVEL (UNFRACTIONATED): Heparin Unfractionated: 0.96 [IU]/mL — ABNORMAL HIGH (ref 0.30–0.70)

## 2023-12-03 LAB — APTT: aPTT: 85 s — ABNORMAL HIGH (ref 24–36)

## 2023-12-03 LAB — MAGNESIUM: Magnesium: 2 mg/dL (ref 1.7–2.4)

## 2023-12-03 SURGERY — RIGHT/LEFT HEART CATH AND CORONARY ANGIOGRAPHY
Anesthesia: LOCAL

## 2023-12-03 MED ORDER — HEPARIN (PORCINE) IN NACL 1000-0.9 UT/500ML-% IV SOLN
INTRAVENOUS | Status: DC | PRN
  Administered 2023-12-03 (×2): 500 mL

## 2023-12-03 MED ORDER — HEPARIN SODIUM (PORCINE) 1000 UNIT/ML IJ SOLN
INTRAMUSCULAR | Status: DC | PRN
  Administered 2023-12-03: 4000 [IU] via INTRAVENOUS

## 2023-12-03 MED ORDER — FENTANYL CITRATE (PF) 100 MCG/2ML IJ SOLN
INTRAMUSCULAR | Status: AC
  Filled 2023-12-03: qty 2

## 2023-12-03 MED ORDER — FENTANYL CITRATE (PF) 100 MCG/2ML IJ SOLN
INTRAMUSCULAR | Status: DC | PRN
  Administered 2023-12-03: 12.5 ug via INTRAVENOUS

## 2023-12-03 MED ORDER — SODIUM CHLORIDE 0.9% FLUSH
3.0000 mL | Freq: Two times a day (BID) | INTRAVENOUS | Status: DC
  Administered 2023-12-03: 3 mL via INTRAVENOUS

## 2023-12-03 MED ORDER — IOHEXOL 350 MG/ML SOLN
INTRAVENOUS | Status: DC | PRN
  Administered 2023-12-03: 30 mL

## 2023-12-03 MED ORDER — LIDOCAINE HCL (PF) 1 % IJ SOLN
INTRAMUSCULAR | Status: AC
  Filled 2023-12-03: qty 30

## 2023-12-03 MED ORDER — HYDRALAZINE HCL 20 MG/ML IJ SOLN: 10.0000 mg | INTRAMUSCULAR | Status: AC | PRN

## 2023-12-03 MED ORDER — LIDOCAINE HCL (PF) 1 % IJ SOLN
INTRAMUSCULAR | Status: DC | PRN
  Administered 2023-12-03 (×2): 5 mL via INTRADERMAL

## 2023-12-03 MED ORDER — SODIUM CHLORIDE 0.9 % IV SOLN: 250.0000 mL | INTRAVENOUS | Status: DC | PRN

## 2023-12-03 MED ORDER — VERAPAMIL HCL 2.5 MG/ML IV SOLN
INTRAVENOUS | Status: AC
  Filled 2023-12-03: qty 2

## 2023-12-03 MED ORDER — MIDAZOLAM HCL (PF) 2 MG/2ML IJ SOLN
INTRAMUSCULAR | Status: DC | PRN
  Administered 2023-12-03: .5 mg via INTRAVENOUS

## 2023-12-03 MED ORDER — FUROSEMIDE 40 MG PO TABS: 20.0000 mg | ORAL_TABLET | Freq: Every day | ORAL | 11 refills | Status: DC

## 2023-12-03 MED ORDER — MIDAZOLAM HCL 2 MG/2ML IJ SOLN
INTRAMUSCULAR | Status: AC
Start: 2023-12-03 — End: 2023-12-03
  Filled 2023-12-03: qty 2

## 2023-12-03 MED ORDER — VERAPAMIL HCL 2.5 MG/ML IV SOLN
INTRAVENOUS | Status: DC | PRN
  Administered 2023-12-03: 10 mL via INTRA_ARTERIAL

## 2023-12-03 MED ORDER — HEPARIN SODIUM (PORCINE) 1000 UNIT/ML IJ SOLN
INTRAMUSCULAR | Status: AC
  Filled 2023-12-03: qty 10

## 2023-12-03 MED ORDER — SODIUM CHLORIDE 0.9% FLUSH: 3.0000 mL | INTRAVENOUS | Status: DC | PRN

## 2023-12-03 SURGICAL SUPPLY — 12 items
CATH 5FR JL3.5 JR4 ANG PIG MP (CATHETERS) IMPLANT
CATH BALLN WEDGE 5F 110CM (CATHETERS) IMPLANT
CATH INFINITI 5FR AL1 (CATHETERS) IMPLANT
DEVICE RAD COMP TR BAND LRG (VASCULAR PRODUCTS) IMPLANT
GLIDESHEATH SLEND SS 6F .021 (SHEATH) IMPLANT
GUIDEWIRE INQWIRE 1.5J.035X260 (WIRE) IMPLANT
KIT SYRINGE INJ CVI SPIKEX1 (MISCELLANEOUS) IMPLANT
PACK CARDIAC CATHETERIZATION (CUSTOM PROCEDURE TRAY) ×1 IMPLANT
SET ATX-X65L (MISCELLANEOUS) IMPLANT
SHEATH GLIDE SLENDER 4/5FR (SHEATH) IMPLANT
SHEATH PROBE COVER 6X72 (BAG) IMPLANT
WIRE EMERALD ST .035X150CM (WIRE) IMPLANT

## 2023-12-03 NOTE — Discharge Summary (Signed)
 Physician Discharge Summary  Julia Bowman FMW:[id number] DOB: January 18, 1946 DOA: 12/01/2023  PCP: Feliciano Devoria LABOR, MD  Admit date: 12/01/2023 Discharge date: 12/03/2023  Time spent: 23 minutes  Recommendations for Outpatient Follow-up:  Requires outpatient care coordination with structural heart team Dr. Ozell Fell and they will set up a close follow-up visit to discuss repair New medication this hospital stay-Lasix 20  Discharge Diagnoses:  MAIN problem for hospitalization   Acute new decompensated systolic heart failure EF 30-35% Right-sided heart failure additionally  Please see below for itemized issues addressed in HOpsital- refer to other progress notes for clarity if needed  Discharge Condition: Improved  Diet recommendation: Heart healthy  Filed Weights   12/01/23 1611 12/02/23 0628 12/03/23 0543  Weight: 80.3 kg 82.2 kg 79.3 kg    History of present illness:  78 year old female known cognitive impairment hyperlipidemia HTN recent TIA Hospitalization 7/16-7/17 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-discharged on aspirin  Plavix  21 days with aspirin  alone-not felt that imaging consistent with symptoms Found to have severe aortic stenosis 2016 and worsened on echo on recent admission Followed with cardiology 8/1 felt to be euvolemic and discussed repeat echo in 6 months and a 30-day monitor was ordered which she wore for only 7 days and did not show significant arrhythmia focus   Chronology  11/4  presented with DOE A-fib RVR pleural effusion on x-ray treated with metoprolol x 1 as well as Lasix 20 IV      Pertinent imaging/studies till date  11/4 CXR small right pleural effusion blunting of right costophrenic angle 11/5 echocardiogram = EF 30-35%-severe aortic valve stenosis valve area 0.51 valve gradient 51 up from prior of 40 degenerative mitral valve severe mitral annular calcification severely elevated pulmonary arterial  systolic pressures    Assessment  & Plan :      Acute systolic heart failure with severe aortic stenosis and pleural effusions on admission Pleural effusion  Patient was seen by cardiology and then structural heart team-gently diuresed and cardiac cath was performed as above showing moderate pulmonary hypertension Valve area as well as gradient worsened since last echo for aortic valve so will need TAVR Going home on very low-dose Lasix A-fib reported on admission Received metoprolol on admission-here more bigeminy-cardiology felt we could hold off on beta-blockade especially given severe aortic stenosis Cryptogenic TIA Did not wear event monitor-outpatient follow-up with neurology Continue aspirin  81 mg, atorvastatin  20 Very mild cognitive impairment Not apparent  Procedures: Conclusions from cardiac cath 11/6 Mild focal plaquing of proximal LAD (10-20% stenosis).  Otherwise, no angiographically significant coronary artery disease. Moderately elevated left heart filling pressures (PCWP 25, LVEDP 30-35 mmHg). Moderate pulmonary hypertension (PA 45/24, mean 31 mmHg). Mildly elevated right heart filling pressure (mean RA 6, RV 45/6 mmHg). Normal Fick cardiac output/index (CO 5.2 L/min, CI 2.8 L/min/m^2). Moderate to severe aortic valve stenosis (mean gradient 34 mmHg, AVA 0.8 cm^2).    Discharge Exam: Vitals:   12/03/23 0900 12/03/23 1000  BP: 103/70 106/71  Pulse: 80 88  Resp: 18 18  Temp:    SpO2: 94% 92%    Subj on day of d/c   Awake pleasant coherent  General Exam on discharge  No JVD S1-S2 holosystolic murmur RUSB Abdomen soft no rebound no guarding   chest is clear no wheeze no rales no rhonchi Discharge Instructions   Discharge Instructions     Diet - low sodium heart healthy   Complete by: As directed  Discharge instructions   Complete by: As directed    Make sure that you take the new medication called Lasix once a day and probably take it in the  morning with the majority of your activity Follow-up with Dr. Wonda closely for discussion about your valve   Increase activity slowly   Complete by: As directed       Allergies as of 12/03/2023   No Known Allergies      Medication List     TAKE these medications    aspirin  EC 81 MG tablet Take 1 tablet (81 mg total) by mouth daily. Swallow whole.   atorvastatin  20 MG tablet Commonly known as: LIPITOR Take 1 tablet (20 mg total) by mouth daily.   cyanocobalamin  1000 MCG/ML injection Commonly known as: VITAMIN B12 Inject 1,000 mcg into the skin every 30 (thirty) days.   fluticasone  50 MCG/ACT nasal spray Commonly known as: FLONASE  Place 2 sprays into both nostrils daily. 2 sprays each nostril at night   furosemide 40 MG tablet Commonly known as: Lasix Take 0.5 tablets (20 mg total) by mouth daily.   hydroxypropyl methylcellulose / hypromellose 2.5 % ophthalmic solution Commonly known as: ISOPTO TEARS / GONIOVISC Place 1 drop into both eyes 4 (four) times daily as needed for dry eyes.   multivitamin with minerals Tabs tablet Take 1 tablet by mouth daily.   triamcinolone  55 MCG/ACT Aero nasal inhaler Commonly known as: NASACORT  Place 2 sprays into the nose daily. 2 sprays each nostril at night What changed:  when to take this reasons to take this additional instructions       No Known Allergies    The results of significant diagnostics from this hospitalization (including imaging, microbiology, ancillary and laboratory) are listed below for reference.    Significant Diagnostic Studies: CARDIAC CATHETERIZATION Result Date: 12/03/2023 Conclusions: Mild focal plaquing of proximal LAD (10-20% stenosis).  Otherwise, no angiographically significant coronary artery disease. Moderately elevated left heart filling pressures (PCWP 25, LVEDP 30-35 mmHg). Moderate pulmonary hypertension (PA 45/24, mean 31 mmHg). Mildly elevated right heart filling pressure (mean RA 6,  RV 45/6 mmHg). Normal Fick cardiac output/index (CO 5.2 L/min, CI 2.8 L/min/m^2). Moderate to severe aortic valve stenosis (mean gradient 34 mmHg, AVA 0.8 cm^2). Recommendations: Continue gentle diuresis and optimization of acute HFrEF and new atrial fibrillation. Further workup/management of aortic stenosis per structural heart team. Lonni Hanson, MD [facility]  ECHOCARDIOGRAM LIMITED Result Date: 12/02/2023    ECHOCARDIOGRAM LIMITED REPORT   Patient Name:   Julia Bowman Date of Exam: 12/02/2023 Medical Rec #:  [id number]       Height:       65.0 in Accession #:    [id number]      Weight:       181.2 lb Date of Birth:  01/25/1946      BSA:          1.897 m Patient Age:    77 years        BP:           101/64 mmHg Patient Gender: F               HR:           97 bpm. Exam Location:  Inpatient Procedure: Limited Echo, Color Doppler and Limited Color Doppler Indications:    CHF  History:        Patient has prior history of Echocardiogram examinations.  Sonographer:    Charmaine Gaskins Referring  Phys: CAROLE N HALL IMPRESSIONS  1. Left ventricular ejection fraction, by estimation, is 30 to 35%. The left ventricle has moderately decreased function. The left ventricle demonstrates global hypokinesis. There is mild concentric left ventricular hypertrophy.  2. Right ventricular systolic function is normal. The right ventricular size is normal. There is severely elevated pulmonary artery systolic pressure.  3. The mitral valve is degenerative. Mild to moderate mitral valve regurgitation. No evidence of mitral stenosis. Severe mitral annular calcification.  4. Tricuspid valve regurgitation is moderate.  5. The aortic valve is tricuspid. There is severe calcifcation of the aortic valve. There is severe thickening of the aortic valve. Aortic valve regurgitation is moderate. Severe aortic valve stenosis. Aortic valve area, by VTI measures 0.51 cm. Aortic  valve mean gradient measures 51.0 mmHg. Aortic valve Vmax  measures 4.47 m/s.  6. The inferior vena cava is dilated in size with >50% respiratory variability, suggesting right atrial pressure of 8 mmHg. Comparison(s): Changes from prior study are noted. Compared with the echo 07/2023, systolic function is worse. Mean aortic valve gradient has increased from 40 mmHg to 51 mmHg. FINDINGS  Left Ventricle: Left ventricular ejection fraction, by estimation, is 30 to 35%. The left ventricle has moderately decreased function. The left ventricle demonstrates global hypokinesis. The left ventricular internal cavity size was normal in size. There is mild concentric left ventricular hypertrophy. Right Ventricle: The right ventricular size is normal. No increase in right ventricular wall thickness. Right ventricular systolic function is normal. There is severely elevated pulmonary artery systolic pressure. The tricuspid regurgitant velocity is 3.65 m/s, and with an assumed right atrial pressure of 8 mmHg, the estimated right ventricular systolic pressure is 61.3 mmHg. Left Atrium: Left atrial size was normal in size. Right Atrium: Right atrial size was normal in size. Pericardium: There is no evidence of pericardial effusion. Mitral Valve: The mitral valve is degenerative in appearance. Severe mitral annular calcification. Mild to moderate mitral valve regurgitation. No evidence of mitral valve stenosis. MV peak gradient, 9.5 mmHg. The mean mitral valve gradient is 4.0 mmHg. Tricuspid Valve: The tricuspid valve is normal in structure. Tricuspid valve regurgitation is moderate . No evidence of tricuspid stenosis. Aortic Valve: The aortic valve is tricuspid. There is severe calcifcation of the aortic valve. There is severe thickening of the aortic valve. Aortic valve regurgitation is moderate. Severe aortic stenosis is present. Aortic valve mean gradient measures 51.0 mmHg. Aortic valve peak gradient measures 80.1 mmHg. Aortic valve area, by VTI measures 0.51 cm. Pulmonic Valve: The  pulmonic valve was normal in structure. Pulmonic valve regurgitation is not visualized. No evidence of pulmonic stenosis. Aorta: The aortic root is normal in size and structure. Venous: The inferior vena cava is dilated in size with greater than 50% respiratory variability, suggesting right atrial pressure of 8 mmHg. IAS/Shunts: No atrial level shunt detected by color flow Doppler. Additional Comments:  LEFT VENTRICLE PLAX 2D LVIDd:         5.30 cm LVIDs:         4.80 cm LV PW:         1.20 cm LV IVS:        1.30 cm LVOT diam:     2.00 cm LV SV:         49 LV SV Index:   26 LVOT Area:     3.14 cm  LV Volumes (MOD) LV vol d, MOD A2C: 119.0 ml LV vol d, MOD A4C: 134.0 ml LV vol s, MOD  A2C: 84.0 ml LV vol s, MOD A4C: 86.9 ml LV SV MOD A2C:     35.0 ml LV SV MOD A4C:     134.0 ml LV SV MOD BP:      41.0 ml RIGHT VENTRICLE RV S prime:     9.14 cm/s RVSP:           61.3 mmHg LEFT ATRIUM         Index       RIGHT ATRIUM LA diam:    4.40 cm 2.32 cm/m  RA Pressure: 8.00 mmHg  AORTIC VALVE AV Area (Vmax):    0.55 cm AV Area (Vmean):   0.51 cm AV Area (VTI):     0.51 cm AV Vmax:           447.50 cm/s AV Vmean:          345.500 cm/s AV VTI:            0.961 m AV Peak Grad:      80.1 mmHg AV Mean Grad:      51.0 mmHg LVOT Vmax:         78.47 cm/s LVOT Vmean:        56.233 cm/s LVOT VTI:          0.156 m LVOT/AV VTI ratio: 0.16  AORTA Ao Root diam: 3.60 cm Ao Asc diam:  3.85 cm MITRAL VALVE             TRICUSPID VALVE MV Area VTI:  1.90 cm   TR Peak grad:   53.3 mmHg MV Peak grad: 9.5 mmHg   TR Vmax:        365.00 cm/s MV Mean grad: 4.0 mmHg   Estimated RAP:  8.00 mmHg MV Vmax:      1.54 m/s   RVSP:           61.3 mmHg MV Vmean:     95.5 cm/s                          SHUNTS                          Systemic VTI:  0.16 m                          Systemic Diam: 2.00 cm Annabella Scarce MD Electronically signed by Annabella Scarce MD Signature Date/Time: 12/02/2023/2:28:12 PM    Final    DG Chest 2 View Result Date:  12/01/2023 EXAM: 2 VIEW(S) XRAY OF THE CHEST 12/01/2023 04:38:45 PM COMPARISON: 08/12/2023 CLINICAL HISTORY: sob sob FINDINGS: LUNGS AND PLEURA: No focal pulmonary opacity. No pulmonary edema. Blunting of the right costophrenic angle could reflect small pleural effusion. No pneumothorax. HEART AND MEDIASTINUM: No acute abnormality of the cardiac and mediastinal silhouettes. BONES AND SOFT TISSUES: No acute osseous abnormality. IMPRESSION: 1. Small right pleural effusion suspected based on blunting of the right costophrenic angle. Electronically signed by: Franky Crease MD 12/01/2023 05:18 PM EST RP Workstation: [id number]    Microbiology: No results found for this or any previous visit (from the past 240 hours).   Labs: Basic Metabolic Panel: Recent Labs  Lab 12/01/23 1618 12/02/23 0413 12/03/23 0340  NA 141 141 140  K 4.1 3.5 4.3  CL 106 104 108  CO2 22 25 22   GLUCOSE 102* 100* 93  BUN 12 13 14   CREATININE  0.77 0.87 0.82  CALCIUM  9.4 8.8* 8.7*  MG 2.2 2.0 2.0  PHOS  --  4.5  --    Liver Function Tests: Recent Labs  Lab 12/01/23 1618 12/03/23 0340  AST 28 22  ALT 15 15  ALKPHOS 87 61  BILITOT 0.8 0.9  PROT 7.2 6.2*  ALBUMIN 4.1 3.1*   No results for input(s): LIPASE, AMYLASE in the last 168 hours. No results for input(s): AMMONIA in the last 168 hours. CBC: Recent Labs  Lab 12/01/23 1618 12/02/23 0413 12/03/23 0340  WBC 5.3 5.4 5.6  NEUTROABS 3.4  --   --   HGB 11.9* 11.3* 11.1*  HCT 36.8 35.0* 34.1*  MCV 89.5 89.7 89.5  PLT 241 227 215   Cardiac Enzymes: No results for input(s): CKTOTAL, CKMB, CKMBINDEX, TROPONINI in the last 168 hours. BNP: BNP (last 3 results) No results for input(s): BNP in the last 8760 hours.  ProBNP (last 3 results) Recent Labs    12/01/23 1618  PROBNP 7,643.0*    CBG: Recent Labs  Lab 12/02/23 1132  GLUCAP 116*    Signed:  Colen Grimes MD   [facility] 12/03/2023, 11:33 AM

## 2023-12-03 NOTE — Progress Notes (Signed)
 Progress Note  Patient Name: Julia Bowman Date of Encounter: 12/03/2023 [facility] Cardiologist: Annabella Scarce, MD   Interval Summary   Feeling fine.  No chest pain or shortness of breath at rest.  Did well with cardiac catheterization.  Friends at bedside.  Vital Signs Vitals:   12/03/23 0816 12/03/23 0840 12/03/23 0900 12/03/23 1000  BP: 104/68 105/73 103/70 106/71  Pulse: 85 86 80 88  Resp: (!) 21 17 18 18   Temp:  98 F (36.7 C)    TempSrc:  Oral    SpO2: 92% 94% 94% 92%  Weight:      Height:        Intake/Output Summary (Last 24 hours) at 12/03/2023 1046 Last data filed at 12/03/2023 0442 Gross per 24 hour  Intake 444.27 ml  Output --  Net 444.27 ml      12/03/2023    5:43 AM 12/02/2023    6:28 AM 12/01/2023    4:11 PM  Last 3 Weights  Weight (lbs) 174 lb 12.8 oz 181 lb 3.5 oz 177 lb  Weight (kg) 79.289 kg 82.2 kg 80.287 kg      Telemetry/ECG  Sinus rhythm, occasional PACs and PVCs - Personally Reviewed  Physical Exam  GEN: No acute distress.   Neck: No JVD Cardiac: RRR, 2/6 harsh crescendo decrescendo murmur at the right upper sternal border Respiratory: Clear to auscultation bilaterally. GI: Soft, nontender, non-distended  MS: No edema, TR band in place over the right wrist with no hematoma or ecchymosis, right antecubital site with no hematoma or ecchymosis  Cardiac Studies & Procedures   ______________________________________________________________________________________________ CARDIAC CATHETERIZATION  CARDIAC CATHETERIZATION 12/03/2023  Conclusion Conclusions: Mild focal plaquing of proximal LAD (10-20% stenosis).  Otherwise, no angiographically significant coronary artery disease. Moderately elevated left heart filling pressures (PCWP 25, LVEDP 30-35 mmHg). Moderate pulmonary hypertension (PA 45/24, mean 31 mmHg). Mildly elevated right heart filling pressure (mean RA 6, RV 45/6 mmHg). Normal Fick cardiac output/index (CO 5.2  L/min, CI 2.8 L/min/m^2). Moderate to severe aortic valve stenosis (mean gradient 34 mmHg, AVA 0.8 cm^2).  Recommendations: Continue gentle diuresis and optimization of acute HFrEF and new atrial fibrillation. Further workup/management of aortic stenosis per structural heart team.  Lonni Hanson, MD [facility]  Findings Coronary Findings Diagnostic  Dominance: Right  Left Main Vessel is large. Vessel is angiographically normal.  Left Anterior Descending Vessel is large. Prox LAD lesion is 15% stenosed. The lesion is focal and eccentric.  First Diagonal Branch Vessel is small in size.  Third Diagonal Branch Vessel is moderate in size.  Ramus Intermedius Vessel is small. Vessel is angiographically normal.  Left Circumflex Vessel is large. Vessel is angiographically normal.  First Obtuse Marginal Branch Vessel is moderate in size.  Second Obtuse Marginal Branch Vessel is small in size.  Third Obtuse Marginal Branch Vessel is large in size.  Fourth Obtuse Marginal Branch Vessel is small in size.  Right Coronary Artery Vessel is large. Vessel is angiographically normal.  Right Posterior Descending Artery Vessel is large in size.  Right Posterior Atrioventricular Artery Vessel is small in size.  First Right Posterolateral Branch Vessel is small in size.  Second Right Posterolateral Branch Vessel is small in size.  Intervention  No interventions have been documented.     ECHOCARDIOGRAM  ECHOCARDIOGRAM LIMITED 12/02/2023  Narrative ECHOCARDIOGRAM LIMITED REPORT    Patient Name:   Julia Bowman Date of Exam: 12/02/2023 Medical Rec #:  [id number]  Height:       65.0 in Accession #:    [id number]      Weight:       181.2 lb Date of Birth:  [birth year]      BSA:          1.897 m Patient Age:    78 years        BP:           101/64 mmHg Patient Gender: F               HR:           97 bpm. Exam Location:  Inpatient  Procedure: Limited  Echo, Color Doppler and Limited Color Doppler  Indications:    CHF  History:        Patient has prior history of Echocardiogram examinations.  Sonographer:    Charmaine Gaskins Referring Phys: CAROLE N HALL  IMPRESSIONS   1. Left ventricular ejection fraction, by estimation, is 30 to 35%. The left ventricle has moderately decreased function. The left ventricle demonstrates global hypokinesis. There is mild concentric left ventricular hypertrophy. 2. Right ventricular systolic function is normal. The right ventricular size is normal. There is severely elevated pulmonary artery systolic pressure. 3. The mitral valve is degenerative. Mild to moderate mitral valve regurgitation. No evidence of mitral stenosis. Severe mitral annular calcification. 4. Tricuspid valve regurgitation is moderate. 5. The aortic valve is tricuspid. There is severe calcifcation of the aortic valve. There is severe thickening of the aortic valve. Aortic valve regurgitation is moderate. Severe aortic valve stenosis. Aortic valve area, by VTI measures 0.51 cm. Aortic valve mean gradient measures 51.0 mmHg. Aortic valve Vmax measures 4.47 m/s. 6. The inferior vena cava is dilated in size with >50% respiratory variability, suggesting right atrial pressure of 8 mmHg.  Comparison(s): Changes from prior study are noted. Compared with the echo 07/2023, systolic function is worse. Mean aortic valve gradient has increased from 40 mmHg to 51 mmHg.  FINDINGS Left Ventricle: Left ventricular ejection fraction, by estimation, is 30 to 35%. The left ventricle has moderately decreased function. The left ventricle demonstrates global hypokinesis. The left ventricular internal cavity size was normal in size. There is mild concentric left ventricular hypertrophy.  Right Ventricle: The right ventricular size is normal. No increase in right ventricular wall thickness. Right ventricular systolic function is normal. There is severely elevated  pulmonary artery systolic pressure. The tricuspid regurgitant velocity is 3.65 m/s, and with an assumed right atrial pressure of 8 mmHg, the estimated right ventricular systolic pressure is 61.3 mmHg.  Left Atrium: Left atrial size was normal in size.  Right Atrium: Right atrial size was normal in size.  Pericardium: There is no evidence of pericardial effusion.  Mitral Valve: The mitral valve is degenerative in appearance. Severe mitral annular calcification. Mild to moderate mitral valve regurgitation. No evidence of mitral valve stenosis. MV peak gradient, 9.5 mmHg. The mean mitral valve gradient is 4.0 mmHg.  Tricuspid Valve: The tricuspid valve is normal in structure. Tricuspid valve regurgitation is moderate . No evidence of tricuspid stenosis.  Aortic Valve: The aortic valve is tricuspid. There is severe calcifcation of the aortic valve. There is severe thickening of the aortic valve. Aortic valve regurgitation is moderate. Severe aortic stenosis is present. Aortic valve mean gradient measures 51.0 mmHg. Aortic valve peak gradient measures 80.1 mmHg. Aortic valve area, by VTI measures 0.51 cm.  Pulmonic Valve: The pulmonic valve was normal in structure. Pulmonic valve regurgitation  is not visualized. No evidence of pulmonic stenosis.  Aorta: The aortic root is normal in size and structure.  Venous: The inferior vena cava is dilated in size with greater than 50% respiratory variability, suggesting right atrial pressure of 8 mmHg.  IAS/Shunts: No atrial level shunt detected by color flow Doppler.  Additional Comments:  LEFT VENTRICLE PLAX 2D LVIDd:         5.30 cm LVIDs:         4.80 cm LV PW:         1.20 cm LV IVS:        1.30 cm LVOT diam:     2.00 cm LV SV:         49 LV SV Index:   26 LVOT Area:     3.14 cm  LV Volumes (MOD) LV vol d, MOD A2C: 119.0 ml LV vol d, MOD A4C: 134.0 ml LV vol s, MOD A2C: 84.0 ml LV vol s, MOD A4C: 86.9 ml LV SV MOD A2C:     35.0 ml LV  SV MOD A4C:     134.0 ml LV SV MOD BP:      41.0 ml  RIGHT VENTRICLE RV S prime:     9.14 cm/s RVSP:           61.3 mmHg  LEFT ATRIUM         Index       RIGHT ATRIUM LA diam:    4.40 cm 2.32 cm/m  RA Pressure: 8.00 mmHg AORTIC VALVE AV Area (Vmax):    0.55 cm AV Area (Vmean):   0.51 cm AV Area (VTI):     0.51 cm AV Vmax:           447.50 cm/s AV Vmean:          345.500 cm/s AV VTI:            0.961 m AV Peak Grad:      80.1 mmHg AV Mean Grad:      51.0 mmHg LVOT Vmax:         78.47 cm/s LVOT Vmean:        56.233 cm/s LVOT VTI:          0.156 m LVOT/AV VTI ratio: 0.16  AORTA Ao Root diam: 3.60 cm Ao Asc diam:  3.85 cm  MITRAL VALVE             TRICUSPID VALVE MV Area VTI:  1.90 cm   TR Peak grad:   53.3 mmHg MV Peak grad: 9.5 mmHg   TR Vmax:        365.00 cm/s MV Mean grad: 4.0 mmHg   Estimated RAP:  8.00 mmHg MV Vmax:      1.54 m/s   RVSP:           61.3 mmHg MV Vmean:     95.5 cm/s SHUNTS Systemic VTI:  0.16 m Systemic Diam: 2.00 cm  Annabella Scarce MD Electronically signed by Annabella Scarce MD Signature Date/Time: 12/02/2023/2:28:12 PM    Final    MONITORS  CARDIAC EVENT MONITOR 10/06/2023  Narrative 30 Day Event Monitor  7 days of data available for analysis  Quality: Fair.  Baseline artifact. Predominant rhythm: sinus rhythm Average heart rate: 87 bpm Max heart rate: 127 bpm Min heart rate: 53 bpm Pauses >2.5 seconds: none  3% PVCs 2% PACs  Tiffany C. Scarce, MD, [facility] 10/23/2023 4:40 PM   CT SCANS  CT CARDIAC SCORING (SELF  PAY ONLY) 11/22/2019  Narrative CLINICAL DATA:  Hyperlipidemia.  Family history of heart disease.  EXAM: CT CARDIAC CORONARY ARTERY CALCIUM  SCORE  TECHNIQUE: Non-contrast imaging through the heart was performed using prospective ECG gating. Image post processing was performed on an independent workstation, allowing for quantitative analysis of the heart and coronary arteries. Note that this exam targets  the heart and the chest was not imaged in its entirety.  COMPARISON:  10/10/2009 chest CT.  FINDINGS: CORONARY CALCIUM  SCORES:  Left Main: 0  LAD: 95.7  LCx: 17.9  RCA: 0  Total Agatston Score: 114  MESA database percentile: 59th  AORTA MEASUREMENTS:  Ascending Aorta: 31 mm  Descending Aorta: 30 mm  OTHER FINDINGS:  No pleural fluid. Nodule along the right minor fissure on 33/9 is similar to on the prior and can be seen presumed a benign subpleural lymph node.  A right lower lobe 5 mm nodule on 58/9 is also similar in 2011 and presumed benign.  Aortic atherosclerosis.  Tortuous thoracic aorta.  Normal heart size.  Aortic valve calcification.  Pulmonary artery enlargement, outflow tract 3.4 cm  No imaged thoracic adenopathy. Surgical changes at the gastroesophageal junction. Esophageal fluid level on 24/3.  Normal imaged portions of the liver, spleen.  Osteopenia with moderate thoracic spondylosis.  IMPRESSION: 1. Total Agatston score of 114, corresponding to 59th percentile for age, sex, and race based cohort. 2. Pulmonary artery enlargement suggests pulmonary arterial hypertension. 3. Esophageal air fluid level suggests dysmotility or gastroesophageal reflux. 4. Aortic Atherosclerosis (ICD10-I70.0). 5. Aortic valvular calcifications. Consider echocardiography to evaluate for valvular dysfunction.   Electronically Signed By: Rockey Kilts M.D. On: 11/22/2019 14:00     ______________________________________________________________________________________________      Assessment & Plan  1.  Severe, stage D1 symptomatic aortic stenosis.  Patient now status post right and left heart catheterization.  Reviewed data by catheter mean gradient is 34 mmHg, but by echo assessment her mean gradient is 50 mmHg.  She clearly has severe aortic stenosis and now with progressive LV dysfunction and symptoms of heart failure.  She has improved with diuresis and is  able to lie flat with no symptoms.  I think the patient is clinically stable from a cardiac perspective but she will require expedited outpatient workup for TAVR.  She should be discharged on furosemide 20 mg daily.  I will arrange close outpatient follow-up with her in the next 1 to 2 weeks to make sure that she remains stable.  We will try to expedite her TAVR is much as possible in the setting of new LV dysfunction.  Patient understands our plans to arrange posthospital follow-up, TAVR CTA studies, and expedited outpatient surgical evaluation. 2.  Acute on chronic HFpEF: As above, new LV dysfunction noted LVEF 30 to 35%.  Patient clinically stable, normotensive, oxygenating well and asymptomatic at rest.  Close outpatient follow-up as above.  Would avoid aggressive GDMT in the setting of her severe aortic stenosis and relatively low blood pressure. 3.  Frequent ectopy.  I do not see any evidence of atrial fibrillation on her monitor.  Will continue low-dose aspirin .  Other problems per TRH team.  Will arrange for follow-up as outlined above.  Will start furosemide 20 mg daily.  [facility] will sign off.   The patient is ready for discharge today from a cardiac standpoint. Medication Recommendations:  start furosemide 20 mg daily Other recommendations (labs, testing, etc):  none Follow up as an outpatient:  will arrange CTA studies,  FU with me in 1-2 weeks, and cardiac surgery consult  For questions or updates, please contact [facility] Please consult [email or web address] for contact info under         Signed, Ozell Fell, MD

## 2023-12-03 NOTE — Interval H&P Note (Signed)
 History and Physical Interval Note:  12/03/2023 7:36 AM  Julia Bowman  has presented today for surgery, with the diagnosis of aortic stenosis and acute heart failure.  The various methods of treatment have been discussed with the patient and family. After consideration of risks, benefits and other options for treatment, the patient has consented to  Procedure(s): RIGHT/LEFT HEART CATH AND CORONARY ANGIOGRAPHY (N/A) as a surgical intervention.  The patient's history has been reviewed, patient examined, no change in status, stable for surgery.  I have reviewed the patient's chart and labs.  Questions were answered to the patient's satisfaction.    Cath Lab Visit (complete for each Cath Lab visit)  Clinical Evaluation Leading to the Procedure:   ACS: No.  Non-ACS:    Anginal/Heart Failure Classification: NYHA class IV  Anti-ischemic medical therapy: No Therapy  Non-Invasive Test Results: No non-invasive testing performed  Prior CABG: No previous CABG  Alohilani Levenhagen

## 2023-12-03 NOTE — Progress Notes (Signed)
   12/03/23 0840  Vitals  Temp 98 F (36.7 C)  Temp Source Oral  BP 105/73  MAP (mmHg) 85  BP Method Automatic  Patient Position (if appropriate) Lying  Pulse Rate 86  Pulse Rate Source Monitor  ECG Heart Rate 88  Resp 17  Level of Consciousness  Level of Consciousness Alert  MEWS COLOR  MEWS Score Color Green  Oxygen Therapy  SpO2 94 %  O2 Device Room Air  MEWS Score  MEWS Temp 0  MEWS Systolic 0  MEWS Pulse 0  MEWS RR 0  MEWS LOC 0  MEWS Score 0   Patient arrived from cath lab with TR band to right wrist no bleeding or hematoma noted at this time. Pt placed back on monitor and vital signs obtained bed side RN aware. Tannia Contino Jessup RN

## 2023-12-03 NOTE — Progress Notes (Signed)
 TR band removed per MD order without difficulty.  Patient educated with regard to limited arm use.

## 2023-12-03 NOTE — Progress Notes (Signed)
 Heart Failure Navigator Progress Note  Assessed for Heart & Vascular TOC clinic readiness.  Patient does not meet criteria due to she is having a TAVR evaluation. No HF TOC. .   Navigator will sign off at this time.   Stephane Haddock, BSN, Scientist, Clinical (histocompatibility And Immunogenetics) Only

## 2023-12-03 NOTE — Progress Notes (Signed)
 PHARMACY - ANTICOAGULATION CONSULT NOTE  Pharmacy Consult for Heparin Indication: atrial fibrillation  No Known Allergies  Patient Measurements: Height: 5' 5 (165.1 cm) Weight: 79.3 kg (174 lb 12.8 oz) IBW/kg (Calculated) : 57 HEPARIN DW (KG): 74  Vital Signs: Temp: 98.4 F (36.9 C) (11/06 0309) Temp Source: Oral (11/06 0309) BP: 108/77 (11/06 0309) Pulse Rate: 86 (11/06 0309)  Labs: Recent Labs    12/01/23 1618 12/02/23 0413 12/02/23 1857 12/03/23 0340  HGB 11.9* 11.3*  --  11.1*  HCT 36.8 35.0*  --  34.1*  PLT 241 227  --  215  APTT  --   --  38* 85*  HEPARINUNFRC  --   --  0.84* 0.96*  CREATININE 0.77 0.87  --  0.82    Estimated Creatinine Clearance: 58.8 mL/min (by C-G formula based on SCr of 0.82 mg/dL).   Medical History: Past Medical History:  Diagnosis Date   Dysrhythmia    Assessment: 78 yr old female to begin IV heparin for new onset atrial fibrillation.  Eliquis 5 mg BID x 1 begun on 11/4 and 1 dose was given at 1821.  Will check aPTTs and heparin levels for correlation, as heparin levels can be falsely elevated due to Eliquis.  aPTT therapeutic, heparin level falsely high from DOAC, CBC ok.  Goal of Therapy:  Heparin level 0.3-0.7 units/ml Monitor platelets by anticoagulation protocol: Yes   Plan:  Heparin 1350 units/h F/U anticoag plans post/cath  Ozell Jamaica, PharmD, BCPS, [facility] Clinical Pharmacist [phone] Please check AMION for all [facility] numbers 12/03/2023

## 2023-12-03 NOTE — Progress Notes (Signed)
 Reviewed AVS, patient expressed understanding of medications, MD follow up reviewed.   Removed IV, Site clean, dry and intact.  Patient states all belongings brought to the hospital at time of admission are accounted for and packed to take home.  Patient informed and expressed understanding where to pick up discharge medications.  [facility] contacted to transport patient to entrance A, where family member was waiting in vehicle to transport home.

## 2023-12-04 DIAGNOSIS — E538 Deficiency of other specified B group vitamins: Secondary | ICD-10-CM | POA: Diagnosis not present

## 2023-12-09 ENCOUNTER — Ambulatory Visit: Attending: Cardiovascular Disease | Admitting: Cardiovascular Disease

## 2023-12-09 ENCOUNTER — Encounter: Payer: Self-pay | Admitting: Cardiovascular Disease

## 2023-12-09 VITALS — BP 116/60 | HR 100 | Ht 65.5 in | Wt 176.6 lb

## 2023-12-09 DIAGNOSIS — I350 Nonrheumatic aortic (valve) stenosis: Secondary | ICD-10-CM

## 2023-12-09 DIAGNOSIS — I5022 Chronic systolic (congestive) heart failure: Secondary | ICD-10-CM

## 2023-12-09 NOTE — Patient Instructions (Signed)
 Medication Instructions:  No medication changes were made at this visit. Continue current regimen.   *If you need a refill on your cardiac medications before your next appointment, please call your pharmacy*  Lab Work: None ordered today. If you have labs (blood work) drawn today and your tests are completely normal, you will receive your results only by: MyChart Message (if you have MyChart) OR A paper copy in the mail If you have any lab test that is abnormal or we need to change your treatment, we will call you to review the results.  Testing/Procedures: None ordered today.  Follow-Up: At [facility], you and your health needs are our priority.  As part of our continuing mission to provide you with exceptional heart care, our providers are all part of one team.  This team includes your primary Cardiologist (physician) and Advanced Practice Providers or APPs (Physician Assistants and Nurse Practitioners) who all work together to provide you with the care you need, when you need it.  Your next appointment:   12/14/23 CT scans  12/17/23 appointment with Dr. Con Clunes   Other Instructions Please refer to your CT instructions on the letter that was provided to you at today's visit.

## 2023-12-09 NOTE — Progress Notes (Signed)
 Pre Surgical Assessment: 5 M Walk Test  54M=16.33ft  5 Meter Walk Test- trial 1: 6.23 seconds 5 Meter Walk Test- trial 2: 6.11  seconds 5 Meter Walk Test- trial 3: 6.17 seconds 5 Meter Walk Test Average: 6.17 seconds

## 2023-12-09 NOTE — Progress Notes (Signed)
 Cardiology Office Note:    Date:  12/09/2023   ID:  Julia Bowman, Julia Bowman January 10, 1946, MRN [id number]  PCP:  Feliciano Devoria LABOR, MD   [facility] Providers Cardiologist:  Annabella Scarce, MD     Referring MD: Feliciano Devoria LABOR, MD   Chief Complaint  Patient presents with   Aortic Stenosis    History of Present Illness:    Julia Bowman is a 78 y.o. female presents for posthospital follow-up evaluation.  She was just hospitalized last week for acute on chronic HFpEF in the setting of severe aortic stenosis.  She responded well to a single dose of IV furosemide.  An echocardiogram demonstrated severe LV dysfunction with LVEF 30 to 35% with global hypokinesis and mild concentric LVH, mild to moderate mitral regurgitation, moderate tricuspid regurgitation, and findings of severe aortic stenosis with a mean gradient of 51 mmHg, V-max of 4.5 m/s, and calculated aortic valve area of 0.51 cm.  Cardiac catheterization in the hospital showed patent coronary arteries with no significant CAD.  Mean PA pressure was mildly elevated at 31 mmHg, cardiac output preserved with an index of 2.8.  She was discharged on furosemide 20 mg daily.  She is scheduled to have pre-TAVR CT angiography studies on November 17 and is scheduled for outpatient cardiac surgical evaluation with Dr. Daniel on 11/20.  The patient is here with her friend today.  She reports continued symptoms of exertional dyspnea, but better than when she came into the hospital.  She no longer has any orthopnea or PND.  She denies leg swelling.  Her friend tells me that the furosemide prescribed at discharge has not mated into her pillbox yet.  She is going to start taking this today.  She has no chest pain or pressure.  She denies lightheadedness or presyncope.   Current Medications: Current Meds  Medication Sig   aspirin  EC 81 MG tablet Take 1 tablet (81 mg total) by mouth daily. Swallow whole.   atorvastatin  (LIPITOR) 20 MG tablet  Take 1 tablet (20 mg total) by mouth daily.   cyanocobalamin  (,VITAMIN B-12,) 1000 MCG/ML injection Inject 1,000 mcg into the skin every 30 (thirty) days.    fluticasone  (FLONASE ) 50 MCG/ACT nasal spray Place 2 sprays into both nostrils daily. 2 sprays each nostril at night (Patient taking differently: Place 2 sprays into both nostrils as needed. 2 sprays each nostril at night)   furosemide (LASIX) 40 MG tablet Take 0.5 tablets (20 mg total) by mouth daily.   hydroxypropyl methylcellulose / hypromellose (ISOPTO TEARS / GONIOVISC) 2.5 % ophthalmic solution Place 1 drop into both eyes 4 (four) times daily as needed for dry eyes.   Multiple Vitamin (MULTIVITAMIN WITH MINERALS) TABS tablet Take 1 tablet by mouth daily.   triamcinolone  (NASACORT ) 55 MCG/ACT AERO nasal inhaler Place 2 sprays into the nose daily. 2 sprays each nostril at night (Patient taking differently: Place 2 sprays into the nose as needed. 2 sprays each nostril at night)     Allergies:   Patient has no known allergies.   ROS:   Please see the history of present illness.    All other systems reviewed and are negative.  EKGs/Labs/Other Studies Reviewed:    The following studies were reviewed today: Cardiac Studies & Procedures   ______________________________________________________________________________________________ CARDIAC CATHETERIZATION  CARDIAC CATHETERIZATION 12/03/2023  Conclusion Conclusions: Mild focal plaquing of proximal LAD (10-20% stenosis).  Otherwise, no angiographically significant coronary artery disease. Moderately elevated left heart filling pressures (PCWP 25,  LVEDP 30-35 mmHg). Moderate pulmonary hypertension (PA 45/24, mean 31 mmHg). Mildly elevated right heart filling pressure (mean RA 6, RV 45/6 mmHg). Normal Fick cardiac output/index (CO 5.2 L/min, CI 2.8 L/min/m^2). Moderate to severe aortic valve stenosis (mean gradient 34 mmHg, AVA 0.8 cm^2).  Recommendations: Continue gentle diuresis and  optimization of acute HFrEF and new atrial fibrillation. Further workup/management of aortic stenosis per structural heart team.  Lonni Hanson, MD [facility]  Findings Coronary Findings Diagnostic  Dominance: Right  Left Main Vessel is large. Vessel is angiographically normal.  Left Anterior Descending Vessel is large. Prox LAD lesion is 15% stenosed. The lesion is focal and eccentric.  First Diagonal Branch Vessel is small in size.  Third Diagonal Branch Vessel is moderate in size.  Ramus Intermedius Vessel is small. Vessel is angiographically normal.  Left Circumflex Vessel is large. Vessel is angiographically normal.  First Obtuse Marginal Branch Vessel is moderate in size.  Second Obtuse Marginal Branch Vessel is small in size.  Third Obtuse Marginal Branch Vessel is large in size.  Fourth Obtuse Marginal Branch Vessel is small in size.  Right Coronary Artery Vessel is large. Vessel is angiographically normal.  Right Posterior Descending Artery Vessel is large in size.  Right Posterior Atrioventricular Artery Vessel is small in size.  First Right Posterolateral Branch Vessel is small in size.  Second Right Posterolateral Branch Vessel is small in size.  Intervention  No interventions have been documented.     ECHOCARDIOGRAM  ECHOCARDIOGRAM LIMITED 12/02/2023  Narrative ECHOCARDIOGRAM LIMITED REPORT    Patient Name:   Julia Bowman Date of Exam: 12/02/2023 Medical Rec #:  [id number]       Height:       65.0 in Accession #:    [id number]      Weight:       181.2 lb Date of Birth:  [birth year]      BSA:          1.897 m Patient Age:    55 years        BP:           101/64 mmHg Patient Gender: F               HR:           97 bpm. Exam Location:  Inpatient  Procedure: Limited Echo, Color Doppler and Limited Color Doppler  Indications:    CHF  History:        Patient has prior history of Echocardiogram  examinations.  Sonographer:    Charmaine Gaskins Referring Phys: CAROLE N HALL  IMPRESSIONS   1. Left ventricular ejection fraction, by estimation, is 30 to 35%. The left ventricle has moderately decreased function. The left ventricle demonstrates global hypokinesis. There is mild concentric left ventricular hypertrophy. 2. Right ventricular systolic function is normal. The right ventricular size is normal. There is severely elevated pulmonary artery systolic pressure. 3. The mitral valve is degenerative. Mild to moderate mitral valve regurgitation. No evidence of mitral stenosis. Severe mitral annular calcification. 4. Tricuspid valve regurgitation is moderate. 5. The aortic valve is tricuspid. There is severe calcifcation of the aortic valve. There is severe thickening of the aortic valve. Aortic valve regurgitation is moderate. Severe aortic valve stenosis. Aortic valve area, by VTI measures 0.51 cm. Aortic valve mean gradient measures 51.0 mmHg. Aortic valve Vmax measures 4.47 m/s. 6. The inferior vena cava is dilated in size with >50% respiratory variability, suggesting right atrial pressure of 8  mmHg.  Comparison(s): Changes from prior study are noted. Compared with the echo 07/2023, systolic function is worse. Mean aortic valve gradient has increased from 40 mmHg to 51 mmHg.  FINDINGS Left Ventricle: Left ventricular ejection fraction, by estimation, is 30 to 35%. The left ventricle has moderately decreased function. The left ventricle demonstrates global hypokinesis. The left ventricular internal cavity size was normal in size. There is mild concentric left ventricular hypertrophy.  Right Ventricle: The right ventricular size is normal. No increase in right ventricular wall thickness. Right ventricular systolic function is normal. There is severely elevated pulmonary artery systolic pressure. The tricuspid regurgitant velocity is 3.65 m/s, and with an assumed right atrial pressure of 8  mmHg, the estimated right ventricular systolic pressure is 61.3 mmHg.  Left Atrium: Left atrial size was normal in size.  Right Atrium: Right atrial size was normal in size.  Pericardium: There is no evidence of pericardial effusion.  Mitral Valve: The mitral valve is degenerative in appearance. Severe mitral annular calcification. Mild to moderate mitral valve regurgitation. No evidence of mitral valve stenosis. MV peak gradient, 9.5 mmHg. The mean mitral valve gradient is 4.0 mmHg.  Tricuspid Valve: The tricuspid valve is normal in structure. Tricuspid valve regurgitation is moderate . No evidence of tricuspid stenosis.  Aortic Valve: The aortic valve is tricuspid. There is severe calcifcation of the aortic valve. There is severe thickening of the aortic valve. Aortic valve regurgitation is moderate. Severe aortic stenosis is present. Aortic valve mean gradient measures 51.0 mmHg. Aortic valve peak gradient measures 80.1 mmHg. Aortic valve area, by VTI measures 0.51 cm.  Pulmonic Valve: The pulmonic valve was normal in structure. Pulmonic valve regurgitation is not visualized. No evidence of pulmonic stenosis.  Aorta: The aortic root is normal in size and structure.  Venous: The inferior vena cava is dilated in size with greater than 50% respiratory variability, suggesting right atrial pressure of 8 mmHg.  IAS/Shunts: No atrial level shunt detected by color flow Doppler.  Additional Comments:  LEFT VENTRICLE PLAX 2D LVIDd:         5.30 cm LVIDs:         4.80 cm LV PW:         1.20 cm LV IVS:        1.30 cm LVOT diam:     2.00 cm LV SV:         49 LV SV Index:   26 LVOT Area:     3.14 cm  LV Volumes (MOD) LV vol d, MOD A2C: 119.0 ml LV vol d, MOD A4C: 134.0 ml LV vol s, MOD A2C: 84.0 ml LV vol s, MOD A4C: 86.9 ml LV SV MOD A2C:     35.0 ml LV SV MOD A4C:     134.0 ml LV SV MOD BP:      41.0 ml  RIGHT VENTRICLE RV S prime:     9.14 cm/s RVSP:           61.3  mmHg  LEFT ATRIUM         Index       RIGHT ATRIUM LA diam:    4.40 cm 2.32 cm/m  RA Pressure: 8.00 mmHg AORTIC VALVE AV Area (Vmax):    0.55 cm AV Area (Vmean):   0.51 cm AV Area (VTI):     0.51 cm AV Vmax:           447.50 cm/s AV Vmean:  345.500 cm/s AV VTI:            0.961 m AV Peak Grad:      80.1 mmHg AV Mean Grad:      51.0 mmHg LVOT Vmax:         78.47 cm/s LVOT Vmean:        56.233 cm/s LVOT VTI:          0.156 m LVOT/AV VTI ratio: 0.16  AORTA Ao Root diam: 3.60 cm Ao Asc diam:  3.85 cm  MITRAL VALVE             TRICUSPID VALVE MV Area VTI:  1.90 cm   TR Peak grad:   53.3 mmHg MV Peak grad: 9.5 mmHg   TR Vmax:        365.00 cm/s MV Mean grad: 4.0 mmHg   Estimated RAP:  8.00 mmHg MV Vmax:      1.54 m/s   RVSP:           61.3 mmHg MV Vmean:     95.5 cm/s SHUNTS Systemic VTI:  0.16 m Systemic Diam: 2.00 cm  Annabella Scarce MD Electronically signed by Annabella Scarce MD Signature Date/Time: 12/02/2023/2:28:12 PM    Final    MONITORS  CARDIAC EVENT MONITOR 10/06/2023  Narrative 30 Day Event Monitor  7 days of data available for analysis  Quality: Fair.  Baseline artifact. Predominant rhythm: sinus rhythm Average heart rate: 87 bpm Max heart rate: 127 bpm Min heart rate: 53 bpm Pauses >2.5 seconds: none  3% PVCs 2% PACs  Tiffany C. Scarce, MD, [facility] 10/23/2023 4:40 PM   CT SCANS  CT CARDIAC SCORING (SELF PAY ONLY) 11/22/2019  Narrative CLINICAL DATA:  Hyperlipidemia.  Family history of heart disease.  EXAM: CT CARDIAC CORONARY ARTERY CALCIUM  SCORE  TECHNIQUE: Non-contrast imaging through the heart was performed using prospective ECG gating. Image post processing was performed on an independent workstation, allowing for quantitative analysis of the heart and coronary arteries. Note that this exam targets the heart and the chest was not imaged in its entirety.  COMPARISON:  10/10/2009 chest CT.  FINDINGS: CORONARY  CALCIUM  SCORES:  Left Main: 0  LAD: 95.7  LCx: 17.9  RCA: 0  Total Agatston Score: 114  MESA database percentile: 59th  AORTA MEASUREMENTS:  Ascending Aorta: 31 mm  Descending Aorta: 30 mm  OTHER FINDINGS:  No pleural fluid. Nodule along the right minor fissure on 33/9 is similar to on the prior and can be seen presumed a benign subpleural lymph node.  A right lower lobe 5 mm nodule on 58/9 is also similar in 2011 and presumed benign.  Aortic atherosclerosis.  Tortuous thoracic aorta.  Normal heart size.  Aortic valve calcification.  Pulmonary artery enlargement, outflow tract 3.4 cm  No imaged thoracic adenopathy. Surgical changes at the gastroesophageal junction. Esophageal fluid level on 24/3.  Normal imaged portions of the liver, spleen.  Osteopenia with moderate thoracic spondylosis.  IMPRESSION: 1. Total Agatston score of 114, corresponding to 59th percentile for age, sex, and race based cohort. 2. Pulmonary artery enlargement suggests pulmonary arterial hypertension. 3. Esophageal air fluid level suggests dysmotility or gastroesophageal reflux. 4. Aortic Atherosclerosis (ICD10-I70.0). 5. Aortic valvular calcifications. Consider echocardiography to evaluate for valvular dysfunction.   Electronically Signed By: Rockey Kilts M.D. On: 11/22/2019 14:00     ______________________________________________________________________________________________      EKG:        Recent Labs: 12/01/2023: Pro Brain Natriuretic Peptide 7,643.0 12/03/2023: ALT  15; BUN 14; Creatinine, Ser 0.82; Hemoglobin 11.2; Magnesium 2.0; Platelets 215; Potassium 4.0; Sodium 141  Recent Lipid Panel    Component Value Date/Time   CHOL 133 10/12/2023 1120   TRIG 69 10/12/2023 1120   HDL 51 10/12/2023 1120   CHOLHDL 2.6 10/12/2023 1120   CHOLHDL 2.8 08/13/2023 0350   VLDL 14 08/13/2023 0350   LDLCALC 68 10/12/2023 1120     Risk Assessment/Calculations:                 Physical Exam:    VS:  BP 116/60 (BP Location: Right Arm, Patient Position: Sitting, Cuff Size: Large)   Pulse 100   Ht 5' 5.5 (1.664 m)   Wt 176 lb 9.6 oz (80.1 kg)   SpO2 97%   BMI 28.94 kg/m     Wt Readings from Last 3 Encounters:  12/09/23 176 lb 9.6 oz (80.1 kg)  12/03/23 174 lb 12.8 oz (79.3 kg)  08/28/23 181 lb 6.4 oz (82.3 kg)    GEN:  Well nourished, well developed pleasant obese woman in no acute distress HEENT: Normal NECK: No JVD; No carotid bruits LYMPHATICS: No lymphadenopathy CARDIAC: RRR, 3/6 harsh crescendo decrescendo murmur at the right upper sternal border RESPIRATORY:  Clear to auscultation without rales, wheezing or rhonchi  ABDOMEN: Soft, non-tender, non-distended MUSCULOSKELETAL:  No edema; No deformity  SKIN: Warm and dry NEUROLOGIC:  Alert and oriented x 3 PSYCHIATRIC:  Normal affect   Assessment & Plan Nonrheumatic aortic valve stenosis Patient with severe, symptomatic aortic stenosis, NYHA functional class III symptoms and new LV dysfunction almost certainly related to her aortic stenosis.  We again discussed the natural history of aortic stenosis, the need for treatment, and further evaluation for TAVR.  I demonstrated the TAVR procedural animation with the patient and her friend and explained the important steps of the procedure, reviewed potential complications, and discussed expected postoperative course.  The patient is scheduled for CT angiography studies next week and formal cardiac surgical consultation to follow-up the CTA studies. Chronic HFrEF (heart failure with reduced ejection fraction) (HCC) Patient now with severe LV dysfunction.  I do not think we can start GDMT until we treat her aortic stenosis.  Her blood pressure today is 116/60 mmHg and we need to avoid hypotension.  Will likely initiate low-dose GDMT when she is in the hospital for TAVR.  For now, I have advised that she start on furosemide 20 mg daily to prevent recurrent heart  failure and pulmonary edema.  Otherwise, we will await treatment of her aortic stenosis.            Medication Adjustments/Labs and Tests Ordered: Current medicines are reviewed at length with the patient today.  Concerns regarding medicines are outlined above.  No orders of the defined types were placed in this encounter.  No orders of the defined types were placed in this encounter.   There are no Patient Instructions on file for this visit.   Signed, Ozell Fell, MD  12/09/2023 1:50 PM    [facility]

## 2023-12-11 DIAGNOSIS — R41841 Cognitive communication deficit: Secondary | ICD-10-CM | POA: Diagnosis not present

## 2023-12-11 DIAGNOSIS — R4189 Other symptoms and signs involving cognitive functions and awareness: Secondary | ICD-10-CM | POA: Diagnosis not present

## 2023-12-14 ENCOUNTER — Inpatient Hospital Stay (HOSPITAL_BASED_OUTPATIENT_CLINIC_OR_DEPARTMENT_OTHER)
Admit: 2023-12-14 | Discharge: 2023-12-14 | Disposition: A | Discharge status: Home / Self Care | Attending: Cardiovascular Disease | Admitting: Cardiovascular Disease

## 2023-12-14 ENCOUNTER — Ambulatory Visit: Payer: Self-pay | Admitting: Cardiovascular Disease

## 2023-12-14 DIAGNOSIS — R918 Other nonspecific abnormal finding of lung field: Secondary | ICD-10-CM | POA: Diagnosis not present

## 2023-12-14 DIAGNOSIS — J90 Pleural effusion, not elsewhere classified: Secondary | ICD-10-CM | POA: Diagnosis not present

## 2023-12-14 DIAGNOSIS — I350 Nonrheumatic aortic (valve) stenosis: Secondary | ICD-10-CM

## 2023-12-14 MED ORDER — IOHEXOL 350 MG/ML SOLN
100.0000 mL | Freq: Once | INTRAVENOUS | Status: AC | PRN
  Administered 2023-12-14: 100 mL via INTRAVENOUS

## 2023-12-14 NOTE — Progress Notes (Signed)
 Procedure Type: Isolated AVR Perioperative Outcome Estimate % Operative Mortality 3.13% Morbidity & Mortality 11.4% Stroke 1.25% Renal Failure 1.23% Reoperation 3.72% Prolonged Ventilation 6.45% Deep Sternal Wound Infection 0.049% Long Hospital Stay (>14 days) 5.92% Short Hospital Stay (<6 days)* 30.2%

## 2023-12-16 DIAGNOSIS — M6281 Muscle weakness (generalized): Secondary | ICD-10-CM | POA: Diagnosis not present

## 2023-12-17 ENCOUNTER — Ambulatory Visit

## 2023-12-17 VITALS — BP 117/79 | HR 94 | Resp 20 | Ht 65.0 in | Wt 175.0 lb

## 2023-12-17 DIAGNOSIS — I350 Nonrheumatic aortic (valve) stenosis: Secondary | ICD-10-CM | POA: Diagnosis not present

## 2023-12-17 NOTE — H&P (View-Only) (Signed)
 [facility]   MULTIDISCIPLINARY HEART VALVE CLINIC    [facility] Medical Record #[id number] Date of Birth: 03-May-1945  Referring: Wonda Sharper, MD Primary Care: Feliciano Devoria LABOR, MD Primary Cardiologist:Tiffany Raford, MD  Chief Complaint:    Chief Complaint  Patient presents with   Aortic Stenosis    TAVR consult, review all studies    History of Present Illness:     Julia [facility]man is a 78 y.o. female presents for evaluation of severe aortic stenosis and TAVR.  Her symptoms are mainly dyspnea on exertion, and she said she breathes fairly well on a great day.  She lives in a retirement community [employer]) and is independent in her ADLs.  She was admitted in early November with decompensated heart failure, and since then has been using a walker for ambulation.  She does have some mild memory impairments and saw a neurologist a month ago who did not recommend any intervention  She is somewhat active and enjoys playing chair volleyball and moving around as much as possible.   During our interview she was able to answer most questions on her own but did refer to her friend Julia Bowman to recall some of her medical history.   Past Medical History:  Diagnosis Date   Dysrhythmia     Past Surgical History:  Procedure Laterality Date   ABDOMINAL HYSTERECTOMY     BREAST BIOPSY     COLONOSCOPY WITH PROPOFOL  N/A 01/17/2015   Procedure: COLONOSCOPY WITH PROPOFOL ;  Surgeon: Elsie Cree, MD;  Location: [facility];  Service: Endoscopy;  Laterality: N/A;   HAND SURGERY     RIGHT/LEFT HEART CATH AND CORONARY ANGIOGRAPHY N/A 12/03/2023   Procedure: RIGHT/LEFT HEART CATH AND CORONARY ANGIOGRAPHY;  Surgeon: Mady Bruckner, MD;  Location: [facility]AS[facility];  Service: Cardiovascular;  Laterality: N/A;   STOMACH SURGERY     TONSILLECTOMY      Social History:  Social History   Tobacco Use  Smoking Status Never  Smokeless Tobacco Not on file     Social History   Substance and Sexual Activity  Alcohol Use No     No Known Allergies    Current Outpatient Medications  Medication Sig Dispense Refill   aspirin  EC 81 MG tablet Take 1 tablet (81 mg total) by mouth daily. Swallow whole. 90 tablet 0   atorvastatin  (LIPITOR) 20 MG tablet Take 1 tablet (20 mg total) by mouth daily. 90 tablet 3   cyanocobalamin  (,VITAMIN B-12,) 1000 MCG/ML injection Inject 1,000 mcg into the skin every 30 (thirty) days.   1   fluticasone  (FLONASE ) 50 MCG/ACT nasal spray Place 2 sprays into both nostrils daily. 2 sprays each nostril at night (Patient taking differently: Place 2 sprays into both nostrils as needed. 2 sprays each nostril at night) 16 g 6   furosemide  (LASIX ) 40 MG tablet Take 0.5 tablets (20 mg total) by mouth daily. 30 tablet 11   hydroxypropyl methylcellulose / hypromellose (ISOPTO TEARS / GONIOVISC) 2.5 % ophthalmic solution Place 1 drop into both eyes 4 (four) times daily as needed for dry eyes.     Multiple Vitamin (MULTIVITAMIN WITH MINERALS) TABS tablet Take 1 tablet by mouth daily.     triamcinolone  (NASACORT ) 55 MCG/ACT AERO nasal inhaler Place 2 sprays into the nose daily. 2 sprays each nostril at night (Patient taking differently: Place 2 sprays into the nose as needed. 2 sprays each nostril at night) 1 each 12   No current  facility-administered medications for this visit.    (Not in a hospital admission)   No family history on file.   Review of Systems:   Review of Systems  Constitutional:  Negative for malaise/fatigue and weight loss.  Respiratory:  Positive for shortness of breath. Negative for cough.   Cardiovascular:  Negative for chest pain, orthopnea and leg swelling.  Gastrointestinal:  Negative for nausea and vomiting.  Neurological:  Negative for dizziness and headaches.      Physical Exam: BP 117/79 (BP Location: Right Arm, Patient Position: Sitting, Cuff Size: Normal)   Pulse 94   Resp 20   Ht 5' 5  (1.651 m)   Wt 175 lb (79.4 kg)   SpO2 96% Comment: RA  BMI 29.12 kg/m  Physical Exam Constitutional:      Appearance: Normal appearance.  Cardiovascular:     Rate and Rhythm: Normal rate and regular rhythm.     Heart sounds: Murmur heard.  Pulmonary:     Effort: Pulmonary effort is normal.     Breath sounds: Normal breath sounds.  Abdominal:     General: There is distension.     Palpations: Abdomen is soft.     Tenderness: There is no abdominal tenderness.  Musculoskeletal:        General: No swelling.  Neurological:     Mental Status: She is alert.       Cardiac Studies & Procedures   ______________________________________________________________________________________________ CARDIAC CATHETERIZATION  CARDIAC CATHETERIZATION 12/03/2023  Conclusion Conclusions: Mild focal plaquing of proximal LAD (10-20% stenosis).  Otherwise, no angiographically significant coronary artery disease. Moderately elevated left heart filling pressures (PCWP 25, LVEDP 30-35 mmHg). Moderate pulmonary hypertension (PA 45/24, mean 31 mmHg). Mildly elevated right heart filling pressure (mean RA 6, RV 45/6 mmHg). Normal Fick cardiac output/index (CO 5.2 L/min, CI 2.8 L/min/m^2). Moderate to severe aortic valve stenosis (mean gradient 34 mmHg, AVA 0.8 cm^2).  Recommendations: Continue gentle diuresis and optimization of acute HFrEF and new atrial fibrillation. Further workup/management of aortic stenosis per structural heart team.  Lonni Hanson, MD [facility]  Findings Coronary Findings Diagnostic  Dominance: Right  Left Main Vessel is large. Vessel is angiographically normal.  Left Anterior Descending Vessel is large. Prox LAD lesion is 15% stenosed. The lesion is focal and eccentric.  First Diagonal Branch Vessel is small in size.  Third Diagonal Branch Vessel is moderate in size.  Ramus Intermedius Vessel is small. Vessel is angiographically normal.  Left  Circumflex Vessel is large. Vessel is angiographically normal.  First Obtuse Marginal Branch Vessel is moderate in size.  Second Obtuse Marginal Branch Vessel is small in size.  Third Obtuse Marginal Branch Vessel is large in size.  Fourth Obtuse Marginal Branch Vessel is small in size.  Right Coronary Artery Vessel is large. Vessel is angiographically normal.  Right Posterior Descending Artery Vessel is large in size.  Right Posterior Atrioventricular Artery Vessel is small in size.  First Right Posterolateral Branch Vessel is small in size.  Second Right Posterolateral Branch Vessel is small in size.  Intervention  No interventions have been documented.     ECHOCARDIOGRAM  ECHOCARDIOGRAM LIMITED 12/02/2023  Narrative ECHOCARDIOGRAM LIMITED REPORT    Patient Name:   Julia Bowman Date of Exam: 12/02/2023 Medical Rec #:  [id number]       Height:       65.0 in Accession #:    [id number]      Weight:       181.2 lb Date  of Birth:  February 06, 1945      BSA:          1.897 m Patient Age:    77 years        BP:           101/64 mmHg Patient Gender: F               HR:           97 bpm. Exam Location:  Inpatient  Procedure: Limited Echo, Color Doppler and Limited Color Doppler  Indications:    CHF  History:        Patient has prior history of Echocardiogram examinations.  Sonographer:    Charmaine Gaskins Referring Phys: CAROLE N HALL  IMPRESSIONS   1. Left ventricular ejection fraction, by estimation, is 30 to 35%. The left ventricle has moderately decreased function. The left ventricle demonstrates global hypokinesis. There is mild concentric left ventricular hypertrophy. 2. Right ventricular systolic function is normal. The right ventricular size is normal. There is severely elevated pulmonary artery systolic pressure. 3. The mitral valve is degenerative. Mild to moderate mitral valve regurgitation. No evidence of mitral stenosis. Severe mitral annular  calcification. 4. Tricuspid valve regurgitation is moderate. 5. The aortic valve is tricuspid. There is severe calcifcation of the aortic valve. There is severe thickening of the aortic valve. Aortic valve regurgitation is moderate. Severe aortic valve stenosis. Aortic valve area, by VTI measures 0.51 cm. Aortic valve mean gradient measures 51.0 mmHg. Aortic valve Vmax measures 4.47 m/s. 6. The inferior vena cava is dilated in size with >50% respiratory variability, suggesting right atrial pressure of 8 mmHg.  Comparison(s): Changes from prior study are noted. Compared with the echo 07/2023, systolic function is worse. Mean aortic valve gradient has increased from 40 mmHg to 51 mmHg.  FINDINGS Left Ventricle: Left ventricular ejection fraction, by estimation, is 30 to 35%. The left ventricle has moderately decreased function. The left ventricle demonstrates global hypokinesis. The left ventricular internal cavity size was normal in size. There is mild concentric left ventricular hypertrophy.  Right Ventricle: The right ventricular size is normal. No increase in right ventricular wall thickness. Right ventricular systolic function is normal. There is severely elevated pulmonary artery systolic pressure. The tricuspid regurgitant velocity is 3.65 m/s, and with an assumed right atrial pressure of 8 mmHg, the estimated right ventricular systolic pressure is 61.3 mmHg.  Left Atrium: Left atrial size was normal in size.  Right Atrium: Right atrial size was normal in size.  Pericardium: There is no evidence of pericardial effusion.  Mitral Valve: The mitral valve is degenerative in appearance. Severe mitral annular calcification. Mild to moderate mitral valve regurgitation. No evidence of mitral valve stenosis. MV peak gradient, 9.5 mmHg. The mean mitral valve gradient is 4.0 mmHg.  Tricuspid Valve: The tricuspid valve is normal in structure. Tricuspid valve regurgitation is moderate . No evidence of  tricuspid stenosis.  Aortic Valve: The aortic valve is tricuspid. There is severe calcifcation of the aortic valve. There is severe thickening of the aortic valve. Aortic valve regurgitation is moderate. Severe aortic stenosis is present. Aortic valve mean gradient measures 51.0 mmHg. Aortic valve peak gradient measures 80.1 mmHg. Aortic valve area, by VTI measures 0.51 cm.  Pulmonic Valve: The pulmonic valve was normal in structure. Pulmonic valve regurgitation is not visualized. No evidence of pulmonic stenosis.  Aorta: The aortic root is normal in size and structure.  Venous: The inferior vena cava is dilated in size with  greater than 50% respiratory variability, suggesting right atrial pressure of 8 mmHg.  IAS/Shunts: No atrial level shunt detected by color flow Doppler.  Additional Comments:  LEFT VENTRICLE PLAX 2D LVIDd:         5.30 cm LVIDs:         4.80 cm LV PW:         1.20 cm LV IVS:        1.30 cm LVOT diam:     2.00 cm LV SV:         49 LV SV Index:   26 LVOT Area:     3.14 cm  LV Volumes (MOD) LV vol d, MOD A2C: 119.0 ml LV vol d, MOD A4C: 134.0 ml LV vol s, MOD A2C: 84.0 ml LV vol s, MOD A4C: 86.9 ml LV SV MOD A2C:     35.0 ml LV SV MOD A4C:     134.0 ml LV SV MOD BP:      41.0 ml  RIGHT VENTRICLE RV S prime:     9.14 cm/s RVSP:           61.3 mmHg  LEFT ATRIUM         Index       RIGHT ATRIUM LA diam:    4.40 cm 2.32 cm/m  RA Pressure: 8.00 mmHg AORTIC VALVE AV Area (Vmax):    0.55 cm AV Area (Vmean):   0.51 cm AV Area (VTI):     0.51 cm AV Vmax:           447.50 cm/s AV Vmean:          345.500 cm/s AV VTI:            0.961 m AV Peak Grad:      80.1 mmHg AV Mean Grad:      51.0 mmHg LVOT Vmax:         78.47 cm/s LVOT Vmean:        56.233 cm/s LVOT VTI:          0.156 m LVOT/AV VTI ratio: 0.16  AORTA Ao Root diam: 3.60 cm Ao Asc diam:  3.85 cm  MITRAL VALVE             TRICUSPID VALVE MV Area VTI:  1.90 cm   TR Peak grad:   53.3  mmHg MV Peak grad: 9.5 mmHg   TR Vmax:        365.00 cm/s MV Mean grad: 4.0 mmHg   Estimated RAP:  8.00 mmHg MV Vmax:      1.54 m/s   RVSP:           61.3 mmHg MV Vmean:     95.5 cm/s SHUNTS Systemic VTI:  0.16 m Systemic Diam: 2.00 cm  Annabella Scarce MD Electronically signed by Annabella Scarce MD Signature Date/Time: 12/02/2023/2:28:12 PM    Final    MONITORS  CARDIAC EVENT MONITOR 10/06/2023  Narrative 30 Day Event Monitor  7 days of data available for analysis  Quality: Fair.  Baseline artifact. Predominant rhythm: sinus rhythm Average heart rate: 87 bpm Max heart rate: 127 bpm Min heart rate: 53 bpm Pauses >2.5 seconds: none  3% PVCs 2% PACs  Tiffany C. Scarce, MD, [facility] 10/23/2023 4:40 PM   CT SCANS  CT CORONARY MORPH W/CTA COR W/SCORE 12/14/2023  Addendum 12/15/2023 11:36 AM ADDENDUM REPORT: 12/15/2023 11:34  EXAM: OVER-READ INTERPRETATION  CT CHEST  The following report is an over-read performed by radiologist Dr.  Limin Xuof [facility] on 12/15/2023. This over-read does not include interpretation of cardiac or coronary anatomy or pathology. The coronary CTA interpretation by the cardiologist is attached.  COMPARISON:  CT cardiac dated 11/22/2019  FINDINGS: Cardiovascular: Normal appearance of extracardiac vascular structures.  Mediastinum/Nodes: Small hiatal hernia. No pathologically enlarged mediastinal or hilar lymph nodes.  Lungs/Pleura: The imaged central airways are patent. Unchanged 6 mm right lower lobe nodule (307:51). Triangular perifissural nodule along the right minor fissure (307:32), likely lymph node. No focal consolidation. No pneumothorax. Trace bilateral pleural effusions, right-greater-than-left.  Upper abdomen: Postsurgical changes of the stomach.  Musculoskeletal: No acute or abnormal lytic or blastic osseous lesions. Multilevel degenerative changes of the partially imaged thoracic and lumbar  spine.  IMPRESSION: 1. Unchanged 6 mm right lower lobe nodule dating back to 11/22/2019, likely benign. 2. Trace bilateral pleural effusions, right-greater-than-left. 3. Small hiatal hernia.   Electronically Signed By: Limin  Xu M.D. On: 12/15/2023 11:34  Narrative CLINICAL DATA:  Aortic valve replacement (TAVR), pre-op eval  EXAM: Cardiac TAVR CT  TECHNIQUE: A non-contrast, gated CT scan was obtained with axial slices of 2.5 mm through the heart for aortic valve scoring. A 120 kV retrospective, gated, contrast cardiac scan was obtained. Gantry rotation speed was 230 msec and collimation was 0.63 mm. Nitroglycerin was not given. A delayed scan was obtained to exclude left atrial appendage thrombus. The 3D dataset was reconstructed in systole with motion correction. The 3D data set was reconstructed in 5% intervals of the 0-95% of the R-R cycle. Systolic and diastolic phases were analyzed on a dedicated workstation using MPR, MIP, and VRT modes. The patient received 100 cc of contrast.  FINDINGS: Aortic Valve:  Tricuspid aortic valve with severely reduced cusp excursion. Severely thickened and severely calcified aortic valve cusps.  AV calcium  score: 4184  Virtual Basal Annulus Measurements:  Maximum/Minimum Diameter: 31.1 x 23.9 mm  Perimeter: 85.9 mm  Area:  563 mm2  No significant LVOT calcifications.  Membranous septal length: 8 mm  Based on these measurements, the annulus would be suitable for a 29 mm Sapien 3 valve. Alternatively, Heart Team can consider 34 mm Evolut valve. Recommend Heart Team discussion for valve selection.  Sinus of Valsalva Measurements:  Non-coronary:  36 mm  Right - coronary:  36 mm  Left - coronary:  36 mm  Coronary height and sinus of Valsalva Height:  Left main: 19 mm, Left sinus: 24 mm  Right coronary: 19 mm, Right sinus: 25 mm  Aorta: Conventional 3 vessel branch pattern of aortic arch. Branch vessel  tortuosity.  Sinotubular Junction:  31 mm  Ascending Thoracic Aorta:  36 mm  Aortic Arch:  33 mm  Descending Thoracic Aorta:  27 mm  Coronary Arteries: Normal coronary origin. Right dominance. The study was performed without use of NTG and insufficient for plaque evaluation. Coronary artery calcium  seen in 3 vessel distribution.  Optimum Fluoroscopic Angle for Delivery: RAO 7 CRA 3  OTHER:  Atria: Mild LA dilation.  Probable small PFO.  Left atrial appendage: No thrombus.  Mitral valve: moderate mitral annular calcifications.  Pulmonary artery: Moderate-severe dilation, 40 mm, suggests pulmonary hypertension.  Pulmonary veins: Normal anatomy.  Trivial pericardial effusion.  IMPRESSION: 1. Tricuspid aortic valve with severely reduced cusp excursion. Severely thickened and severely calcified aortic valve cusps. 2. Aortic valve calcium  score: 4184 3. Annulus area: 563 mm2, suitable for 29 mm Sapien 3 valve. No LVOT calcifications. Membranous septal length 8 mm. 4. Sufficient coronary artery  heights from annulus. 5. Optimum fluoroscopic angle for delivery: RAO 7 CRA 3  Electronically Signed: By: Soyla Merck M.D. On: 12/14/2023 17:08   CT SCANS  CT CARDIAC SCORING (SELF PAY ONLY) 11/22/2019  Narrative CLINICAL DATA:  Hyperlipidemia.  Family history of heart disease.  EXAM: CT CARDIAC CORONARY ARTERY CALCIUM  SCORE  TECHNIQUE: Non-contrast imaging through the heart was performed using prospective ECG gating. Image post processing was performed on an independent workstation, allowing for quantitative analysis of the heart and coronary arteries. Note that this exam targets the heart and the chest was not imaged in its entirety.  COMPARISON:  10/10/2009 chest CT.  FINDINGS: CORONARY CALCIUM  SCORES:  Left Main: 0  LAD: 95.7  LCx: 17.9  RCA: 0  Total Agatston Score: 114  MESA database percentile: 59th  AORTA MEASUREMENTS:  Ascending Aorta: 31  mm  Descending Aorta: 30 mm  OTHER FINDINGS:  No pleural fluid. Nodule along the right minor fissure on 33/9 is similar to on the prior and can be seen presumed a benign subpleural lymph node.  A right lower lobe 5 mm nodule on 58/9 is also similar in 2011 and presumed benign.  Aortic atherosclerosis.  Tortuous thoracic aorta.  Normal heart size.  Aortic valve calcification.  Pulmonary artery enlargement, outflow tract 3.4 cm  No imaged thoracic adenopathy. Surgical changes at the gastroesophageal junction. Esophageal fluid level on 24/3.  Normal imaged portions of the liver, spleen.  Osteopenia with moderate thoracic spondylosis.  IMPRESSION: 1. Total Agatston score of 114, corresponding to 59th percentile for age, sex, and race based cohort. 2. Pulmonary artery enlargement suggests pulmonary arterial hypertension. 3. Esophageal air fluid level suggests dysmotility or gastroesophageal reflux. 4. Aortic Atherosclerosis (ICD10-I70.0). 5. Aortic valvular calcifications. Consider echocardiography to evaluate for valvular dysfunction.   Electronically Signed By: Rockey Kilts M.D. On: 11/22/2019 14:00     ______________________________________________________________________________________________      ECG NSR    I have independently reviewed the above radiologic studies and discussed with the patient   Recent Lab Findings: Lab Results  Component Value Date   WBC 5.6 12/03/2023   HGB 11.2 (L) 12/03/2023   HCT 33.0 (L) 12/03/2023   PLT 215 12/03/2023   GLUCOSE 93 12/03/2023   CHOL 133 10/12/2023   TRIG 69 10/12/2023   HDL 51 10/12/2023   LDLCALC 68 10/12/2023   ALT 15 12/03/2023   AST 22 12/03/2023   NA 141 12/03/2023   K 4.0 12/03/2023   CL 108 12/03/2023   CREATININE 0.82 12/03/2023   BUN 14 12/03/2023   CO2 22 12/03/2023   INR 1.0 08/12/2023   HGBA1C 5.2 08/13/2023      Assessment / Plan:   78 y.o. female with severe aortic stenosis.  STS  score: 3.13%.  NYHA Class II.  The risks and benefits of transfemoral TAVR were discussed in detail.  The risks included death, stroke, paravalvular leak, aortic dissection, annulus rupture, device embolization, acute myocardial infarction, arrhythmia, heart block or need for permanent pacemaker.  We also discussed possibility of an emergent sternotomy to address any procedural complications.  Based on our discussion, we collectively decided that an emergent sternotomy would be indicated.  The patient is agreeable to proceed.  Based on my review of her LHC, echo, and CTA, I agree with the multidisciplinary plan to proceed with a transfemoral 26 mm S3 TAVR.  Of note, the patient has baseline memory deficits and imaging evidence of prior strokes.  She has been evaluated by  neurology and diagnosed with mild cognitive impairment.  I warned the patient and her friend, Julia Bowman, about the possibility of her neurologic symptoms worsening after anesthesia and TAVR.  They understand the risk and wish to proceed with intervention.  I  spent 45 minutes counseling the patient face to face.   Con RAMAN Maliq Pilley 12/17/2023 1:41 PM

## 2023-12-17 NOTE — Progress Notes (Signed)
 [facility]   MULTIDISCIPLINARY HEART VALVE CLINIC    [facility] Medical Record #[id number] Date of Birth: 03-May-1945  Referring: Wonda Sharper, MD Primary Care: Feliciano Devoria LABOR, MD Primary Cardiologist:Tiffany Raford, MD  Chief Complaint:    Chief Complaint  Patient presents with   Aortic Stenosis    TAVR consult, review all studies    History of Present Illness:     Julia Bowman is a 78 y.o. female presents for evaluation of severe aortic stenosis and TAVR.  Her symptoms are mainly dyspnea on exertion, and she said she breathes fairly well on a great day.  She lives in a retirement community [employer]) and is independent in her ADLs.  She was admitted in early November with decompensated heart failure, and since then has been using a walker for ambulation.  She does have some mild memory impairments and saw a neurologist a month ago who did not recommend any intervention  She is somewhat active and enjoys playing chair volleyball and moving around as much as possible.   During our interview she was able to answer most questions on her own but did refer to her friend [name] to recall some of her medical history.   Past Medical History:  Diagnosis Date   Dysrhythmia     Past Surgical History:  Procedure Laterality Date   ABDOMINAL HYSTERECTOMY     BREAST BIOPSY     COLONOSCOPY WITH PROPOFOL  N/A 01/17/2015   Procedure: COLONOSCOPY WITH PROPOFOL ;  Surgeon: Elsie Cree, MD;  Location: [facility];  Service: Endoscopy;  Laterality: N/A;   HAND SURGERY     RIGHT/LEFT HEART CATH AND CORONARY ANGIOGRAPHY N/A 12/03/2023   Procedure: RIGHT/LEFT HEART CATH AND CORONARY ANGIOGRAPHY;  Surgeon: Mady Bruckner, MD;  Location: [facility]AS[facility];  Service: Cardiovascular;  Laterality: N/A;   STOMACH SURGERY     TONSILLECTOMY      Social History:  Social History   Tobacco Use  Smoking Status Never  Smokeless Tobacco Not on file     Social History   Substance and Sexual Activity  Alcohol Use No     No Known Allergies    Current Outpatient Medications  Medication Sig Dispense Refill   aspirin  EC 81 MG tablet Take 1 tablet (81 mg total) by mouth daily. Swallow whole. 90 tablet 0   atorvastatin  (LIPITOR) 20 MG tablet Take 1 tablet (20 mg total) by mouth daily. 90 tablet 3   cyanocobalamin  (,VITAMIN B-12,) 1000 MCG/ML injection Inject 1,000 mcg into the skin every 30 (thirty) days.   1   fluticasone  (FLONASE ) 50 MCG/ACT nasal spray Place 2 sprays into both nostrils daily. 2 sprays each nostril at night (Patient taking differently: Place 2 sprays into both nostrils as needed. 2 sprays each nostril at night) 16 g 6   furosemide  (LASIX ) 40 MG tablet Take 0.5 tablets (20 mg total) by mouth daily. 30 tablet 11   hydroxypropyl methylcellulose / hypromellose (ISOPTO TEARS / GONIOVISC) 2.5 % ophthalmic solution Place 1 drop into both eyes 4 (four) times daily as needed for dry eyes.     Multiple Vitamin (MULTIVITAMIN WITH MINERALS) TABS tablet Take 1 tablet by mouth daily.     triamcinolone  (NASACORT ) 55 MCG/ACT AERO nasal inhaler Place 2 sprays into the nose daily. 2 sprays each nostril at night (Patient taking differently: Place 2 sprays into the nose as needed. 2 sprays each nostril at night) 1 each 12   No current  facility-administered medications for this visit.    (Not in a hospital admission)   No family history on file.   Review of Systems:   Review of Systems  Constitutional:  Negative for malaise/fatigue and weight loss.  Respiratory:  Positive for shortness of breath. Negative for cough.   Cardiovascular:  Negative for chest pain, orthopnea and leg swelling.  Gastrointestinal:  Negative for nausea and vomiting.  Neurological:  Negative for dizziness and headaches.      Physical Exam: BP 117/79 (BP Location: Right Arm, Patient Position: Sitting, Cuff Size: Normal)   Pulse 94   Resp 20   Ht 5' 5  (1.651 m)   Wt 175 lb (79.4 kg)   SpO2 96% Comment: RA  BMI 29.12 kg/m  Physical Exam Constitutional:      Appearance: Normal appearance.  Cardiovascular:     Rate and Rhythm: Normal rate and regular rhythm.     Heart sounds: Murmur heard.  Pulmonary:     Effort: Pulmonary effort is normal.     Breath sounds: Normal breath sounds.  Abdominal:     General: There is distension.     Palpations: Abdomen is soft.     Tenderness: There is no abdominal tenderness.  Musculoskeletal:        General: No swelling.  Neurological:     Mental Status: She is alert.       Cardiac Studies & Procedures   ______________________________________________________________________________________________ CARDIAC CATHETERIZATION  CARDIAC CATHETERIZATION 12/03/2023  Conclusion Conclusions: Mild focal plaquing of proximal LAD (10-20% stenosis).  Otherwise, no angiographically significant coronary artery disease. Moderately elevated left heart filling pressures (PCWP 25, LVEDP 30-35 mmHg). Moderate pulmonary hypertension (PA 45/24, mean 31 mmHg). Mildly elevated right heart filling pressure (mean RA 6, RV 45/6 mmHg). Normal Fick cardiac output/index (CO 5.2 L/min, CI 2.8 L/min/m^2). Moderate to severe aortic valve stenosis (mean gradient 34 mmHg, AVA 0.8 cm^2).  Recommendations: Continue gentle diuresis and optimization of acute HFrEF and new atrial fibrillation. Further workup/management of aortic stenosis per structural heart team.  Lonni Hanson, MD [facility]  Findings Coronary Findings Diagnostic  Dominance: Right  Left Main Vessel is large. Vessel is angiographically normal.  Left Anterior Descending Vessel is large. Prox LAD lesion is 15% stenosed. The lesion is focal and eccentric.  First Diagonal Branch Vessel is small in size.  Third Diagonal Branch Vessel is moderate in size.  Ramus Intermedius Vessel is small. Vessel is angiographically normal.  Left  Circumflex Vessel is large. Vessel is angiographically normal.  First Obtuse Marginal Branch Vessel is moderate in size.  Second Obtuse Marginal Branch Vessel is small in size.  Third Obtuse Marginal Branch Vessel is large in size.  Fourth Obtuse Marginal Branch Vessel is small in size.  Right Coronary Artery Vessel is large. Vessel is angiographically normal.  Right Posterior Descending Artery Vessel is large in size.  Right Posterior Atrioventricular Artery Vessel is small in size.  First Right Posterolateral Branch Vessel is small in size.  Second Right Posterolateral Branch Vessel is small in size.  Intervention  No interventions have been documented.     ECHOCARDIOGRAM  ECHOCARDIOGRAM LIMITED 12/02/2023  Narrative ECHOCARDIOGRAM LIMITED REPORT    Patient Name:   QUENTINA FRONEK Date of Exam: 12/02/2023 Medical Rec #:  [id number]       Height:       65.0 in Accession #:    [id number]      Weight:       181.2 lb Date  of Birth:  03/13/1945      BSA:          1.897 m Patient Age:    77 years        BP:           101/64 mmHg Patient Gender: F               HR:           97 bpm. Exam Location:  Inpatient  Procedure: Limited Echo, Color Doppler and Limited Color Doppler  Indications:    CHF  History:        Patient has prior history of Echocardiogram examinations.  Sonographer:    Charmaine Gaskins Referring Phys: CAROLE N HALL  IMPRESSIONS   1. Left ventricular ejection fraction, by estimation, is 30 to 35%. The left ventricle has moderately decreased function. The left ventricle demonstrates global hypokinesis. There is mild concentric left ventricular hypertrophy. 2. Right ventricular systolic function is normal. The right ventricular size is normal. There is severely elevated pulmonary artery systolic pressure. 3. The mitral valve is degenerative. Mild to moderate mitral valve regurgitation. No evidence of mitral stenosis. Severe mitral annular  calcification. 4. Tricuspid valve regurgitation is moderate. 5. The aortic valve is tricuspid. There is severe calcifcation of the aortic valve. There is severe thickening of the aortic valve. Aortic valve regurgitation is moderate. Severe aortic valve stenosis. Aortic valve area, by VTI measures 0.51 cm. Aortic valve mean gradient measures 51.0 mmHg. Aortic valve Vmax measures 4.47 m/s. 6. The inferior vena cava is dilated in size with >50% respiratory variability, suggesting right atrial pressure of 8 mmHg.  Comparison(s): Changes from prior study are noted. Compared with the echo 07/2023, systolic function is worse. Mean aortic valve gradient has increased from 40 mmHg to 51 mmHg.  FINDINGS Left Ventricle: Left ventricular ejection fraction, by estimation, is 30 to 35%. The left ventricle has moderately decreased function. The left ventricle demonstrates global hypokinesis. The left ventricular internal cavity size was normal in size. There is mild concentric left ventricular hypertrophy.  Right Ventricle: The right ventricular size is normal. No increase in right ventricular wall thickness. Right ventricular systolic function is normal. There is severely elevated pulmonary artery systolic pressure. The tricuspid regurgitant velocity is 3.65 m/s, and with an assumed right atrial pressure of 8 mmHg, the estimated right ventricular systolic pressure is 61.3 mmHg.  Left Atrium: Left atrial size was normal in size.  Right Atrium: Right atrial size was normal in size.  Pericardium: There is no evidence of pericardial effusion.  Mitral Valve: The mitral valve is degenerative in appearance. Severe mitral annular calcification. Mild to moderate mitral valve regurgitation. No evidence of mitral valve stenosis. MV peak gradient, 9.5 mmHg. The mean mitral valve gradient is 4.0 mmHg.  Tricuspid Valve: The tricuspid valve is normal in structure. Tricuspid valve regurgitation is moderate . No evidence of  tricuspid stenosis.  Aortic Valve: The aortic valve is tricuspid. There is severe calcifcation of the aortic valve. There is severe thickening of the aortic valve. Aortic valve regurgitation is moderate. Severe aortic stenosis is present. Aortic valve mean gradient measures 51.0 mmHg. Aortic valve peak gradient measures 80.1 mmHg. Aortic valve area, by VTI measures 0.51 cm.  Pulmonic Valve: The pulmonic valve was normal in structure. Pulmonic valve regurgitation is not visualized. No evidence of pulmonic stenosis.  Aorta: The aortic root is normal in size and structure.  Venous: The inferior vena cava is dilated in size with  greater than 50% respiratory variability, suggesting right atrial pressure of 8 mmHg.  IAS/Shunts: No atrial level shunt detected by color flow Doppler.  Additional Comments:  LEFT VENTRICLE PLAX 2D LVIDd:         5.30 cm LVIDs:         4.80 cm LV PW:         1.20 cm LV IVS:        1.30 cm LVOT diam:     2.00 cm LV SV:         49 LV SV Index:   26 LVOT Area:     3.14 cm  LV Volumes (MOD) LV vol d, MOD A2C: 119.0 ml LV vol d, MOD A4C: 134.0 ml LV vol s, MOD A2C: 84.0 ml LV vol s, MOD A4C: 86.9 ml LV SV MOD A2C:     35.0 ml LV SV MOD A4C:     134.0 ml LV SV MOD BP:      41.0 ml  RIGHT VENTRICLE RV S prime:     9.14 cm/s RVSP:           61.3 mmHg  LEFT ATRIUM         Index       RIGHT ATRIUM LA diam:    4.40 cm 2.32 cm/m  RA Pressure: 8.00 mmHg AORTIC VALVE AV Area (Vmax):    0.55 cm AV Area (Vmean):   0.51 cm AV Area (VTI):     0.51 cm AV Vmax:           447.50 cm/s AV Vmean:          345.500 cm/s AV VTI:            0.961 m AV Peak Grad:      80.1 mmHg AV Mean Grad:      51.0 mmHg LVOT Vmax:         78.47 cm/s LVOT Vmean:        56.233 cm/s LVOT VTI:          0.156 m LVOT/AV VTI ratio: 0.16  AORTA Ao Root diam: 3.60 cm Ao Asc diam:  3.85 cm  MITRAL VALVE             TRICUSPID VALVE MV Area VTI:  1.90 cm   TR Peak grad:   53.3  mmHg MV Peak grad: 9.5 mmHg   TR Vmax:        365.00 cm/s MV Mean grad: 4.0 mmHg   Estimated RAP:  8.00 mmHg MV Vmax:      1.54 m/s   RVSP:           61.3 mmHg MV Vmean:     95.5 cm/s SHUNTS Systemic VTI:  0.16 m Systemic Diam: 2.00 cm  Annabella Scarce MD Electronically signed by Annabella Scarce MD Signature Date/Time: 12/02/2023/2:28:12 PM    Final    MONITORS  CARDIAC EVENT MONITOR 10/06/2023  Narrative 30 Day Event Monitor  7 days of data available for analysis  Quality: Fair.  Baseline artifact. Predominant rhythm: sinus rhythm Average heart rate: 87 bpm Max heart rate: 127 bpm Min heart rate: 53 bpm Pauses >2.5 seconds: none  3% PVCs 2% PACs  Tiffany C. Scarce, MD, [facility] 10/23/2023 4:40 PM   CT SCANS  CT CORONARY MORPH W/CTA COR W/SCORE 12/14/2023  Addendum 12/15/2023 11:36 AM ADDENDUM REPORT: 12/15/2023 11:34  EXAM: OVER-READ INTERPRETATION  CT CHEST  The following report is an over-read performed by radiologist Dr.  Limin Xuof [facility] on 12/15/2023. This over-read does not include interpretation of cardiac or coronary anatomy or pathology. The coronary CTA interpretation by the cardiologist is attached.  COMPARISON:  CT cardiac dated 11/22/2019  FINDINGS: Cardiovascular: Normal appearance of extracardiac vascular structures.  Mediastinum/Nodes: Small hiatal hernia. No pathologically enlarged mediastinal or hilar lymph nodes.  Lungs/Pleura: The imaged central airways are patent. Unchanged 6 mm right lower lobe nodule (307:51). Triangular perifissural nodule along the right minor fissure (307:32), likely lymph node. No focal consolidation. No pneumothorax. Trace bilateral pleural effusions, right-greater-than-left.  Upper abdomen: Postsurgical changes of the stomach.  Musculoskeletal: No acute or abnormal lytic or blastic osseous lesions. Multilevel degenerative changes of the partially imaged thoracic and lumbar  spine.  IMPRESSION: 1. Unchanged 6 mm right lower lobe nodule dating back to 11/22/2019, likely benign. 2. Trace bilateral pleural effusions, right-greater-than-left. 3. Small hiatal hernia.   Electronically Signed By: Limin  Xu M.D. On: 12/15/2023 11:34  Narrative CLINICAL DATA:  Aortic valve replacement (TAVR), pre-op eval  EXAM: Cardiac TAVR CT  TECHNIQUE: A non-contrast, gated CT scan was obtained with axial slices of 2.5 mm through the heart for aortic valve scoring. A 120 kV retrospective, gated, contrast cardiac scan was obtained. Gantry rotation speed was 230 msec and collimation was 0.63 mm. Nitroglycerin was not given. A delayed scan was obtained to exclude left atrial appendage thrombus. The 3D dataset was reconstructed in systole with motion correction. The 3D data set was reconstructed in 5% intervals of the 0-95% of the R-R cycle. Systolic and diastolic phases were analyzed on a dedicated workstation using MPR, MIP, and VRT modes. The patient received 100 cc of contrast.  FINDINGS: Aortic Valve:  Tricuspid aortic valve with severely reduced cusp excursion. Severely thickened and severely calcified aortic valve cusps.  AV calcium  score: 4184  Virtual Basal Annulus Measurements:  Maximum/Minimum Diameter: 31.1 x 23.9 mm  Perimeter: 85.9 mm  Area:  563 mm2  No significant LVOT calcifications.  Membranous septal length: 8 mm  Based on these measurements, the annulus would be suitable for a 29 mm Sapien 3 valve. Alternatively, Heart Team can consider 34 mm Evolut valve. Recommend Heart Team discussion for valve selection.  Sinus of Valsalva Measurements:  Non-coronary:  36 mm  Right - coronary:  36 mm  Left - coronary:  36 mm  Coronary height and sinus of Valsalva Height:  Left main: 19 mm, Left sinus: 24 mm  Right coronary: 19 mm, Right sinus: 25 mm  Aorta: Conventional 3 vessel branch pattern of aortic arch. Branch vessel  tortuosity.  Sinotubular Junction:  31 mm  Ascending Thoracic Aorta:  36 mm  Aortic Arch:  33 mm  Descending Thoracic Aorta:  27 mm  Coronary Arteries: Normal coronary origin. Right dominance. The study was performed without use of NTG and insufficient for plaque evaluation. Coronary artery calcium  seen in 3 vessel distribution.  Optimum Fluoroscopic Angle for Delivery: RAO 7 CRA 3  OTHER:  Atria: Mild LA dilation.  Probable small PFO.  Left atrial appendage: No thrombus.  Mitral valve: moderate mitral annular calcifications.  Pulmonary artery: Moderate-severe dilation, 40 mm, suggests pulmonary hypertension.  Pulmonary veins: Normal anatomy.  Trivial pericardial effusion.  IMPRESSION: 1. Tricuspid aortic valve with severely reduced cusp excursion. Severely thickened and severely calcified aortic valve cusps. 2. Aortic valve calcium  score: 4184 3. Annulus area: 563 mm2, suitable for 29 mm Sapien 3 valve. No LVOT calcifications. Membranous septal length 8 mm. 4. Sufficient coronary artery  heights from annulus. 5. Optimum fluoroscopic angle for delivery: RAO 7 CRA 3  Electronically Signed: By: Soyla Merck M.D. On: 12/14/2023 17:08   CT SCANS  CT CARDIAC SCORING (SELF PAY ONLY) 11/22/2019  Narrative CLINICAL DATA:  Hyperlipidemia.  Family history of heart disease.  EXAM: CT CARDIAC CORONARY ARTERY CALCIUM  SCORE  TECHNIQUE: Non-contrast imaging through the heart was performed using prospective ECG gating. Image post processing was performed on an independent workstation, allowing for quantitative analysis of the heart and coronary arteries. Note that this exam targets the heart and the chest was not imaged in its entirety.  COMPARISON:  10/10/2009 chest CT.  FINDINGS: CORONARY CALCIUM  SCORES:  Left Main: 0  LAD: 95.7  LCx: 17.9  RCA: 0  Total Agatston Score: 114  MESA database percentile: 59th  AORTA MEASUREMENTS:  Ascending Aorta: 31  mm  Descending Aorta: 30 mm  OTHER FINDINGS:  No pleural fluid. Nodule along the right minor fissure on 33/9 is similar to on the prior and can be seen presumed a benign subpleural lymph node.  A right lower lobe 5 mm nodule on 58/9 is also similar in 2011 and presumed benign.  Aortic atherosclerosis.  Tortuous thoracic aorta.  Normal heart size.  Aortic valve calcification.  Pulmonary artery enlargement, outflow tract 3.4 cm  No imaged thoracic adenopathy. Surgical changes at the gastroesophageal junction. Esophageal fluid level on 24/3.  Normal imaged portions of the liver, spleen.  Osteopenia with moderate thoracic spondylosis.  IMPRESSION: 1. Total Agatston score of 114, corresponding to 59th percentile for age, sex, and race based cohort. 2. Pulmonary artery enlargement suggests pulmonary arterial hypertension. 3. Esophageal air fluid level suggests dysmotility or gastroesophageal reflux. 4. Aortic Atherosclerosis (ICD10-I70.0). 5. Aortic valvular calcifications. Consider echocardiography to evaluate for valvular dysfunction.   Electronically Signed By: Rockey Kilts M.D. On: 11/22/2019 14:00     ______________________________________________________________________________________________      ECG NSR    I have independently reviewed the above radiologic studies and discussed with the patient   Recent Lab Findings: Lab Results  Component Value Date   WBC 5.6 12/03/2023   HGB 11.2 (L) 12/03/2023   HCT 33.0 (L) 12/03/2023   PLT 215 12/03/2023   GLUCOSE 93 12/03/2023   CHOL 133 10/12/2023   TRIG 69 10/12/2023   HDL 51 10/12/2023   LDLCALC 68 10/12/2023   ALT 15 12/03/2023   AST 22 12/03/2023   NA 141 12/03/2023   K 4.0 12/03/2023   CL 108 12/03/2023   CREATININE 0.82 12/03/2023   BUN 14 12/03/2023   CO2 22 12/03/2023   INR 1.0 08/12/2023   HGBA1C 5.2 08/13/2023      Assessment / Plan:   78 y.o. female with severe aortic stenosis.  STS  score: 3.13%.  NYHA Class II.  The risks and benefits of transfemoral TAVR were discussed in detail.  The risks included death, stroke, paravalvular leak, aortic dissection, annulus rupture, device embolization, acute myocardial infarction, arrhythmia, heart block or need for permanent pacemaker.  We also discussed possibility of an emergent sternotomy to address any procedural complications.  Based on our discussion, we collectively decided that an emergent sternotomy would be indicated.  The patient is agreeable to proceed.  Based on my review of her LHC, echo, and CTA, I agree with the multidisciplinary plan to proceed with a transfemoral 26 mm S3 TAVR.  Of note, the patient has baseline memory deficits and imaging evidence of prior strokes.  She has been evaluated by  neurology and diagnosed with mild cognitive impairment.  I warned the patient and her friend, [name], about the possibility of her neurologic symptoms worsening after anesthesia and TAVR.  They understand the risk and wish to proceed with intervention.  I  spent 45 minutes counseling the patient face to face.   Con RAMAN Maliq Pilley 12/17/2023 1:41 PM

## 2023-12-18 DIAGNOSIS — R4189 Other symptoms and signs involving cognitive functions and awareness: Secondary | ICD-10-CM | POA: Diagnosis not present

## 2023-12-18 DIAGNOSIS — R41841 Cognitive communication deficit: Secondary | ICD-10-CM | POA: Diagnosis not present

## 2023-12-21 ENCOUNTER — Other Ambulatory Visit: Payer: Self-pay

## 2023-12-21 DIAGNOSIS — I350 Nonrheumatic aortic (valve) stenosis: Secondary | ICD-10-CM

## 2023-12-23 ENCOUNTER — Encounter (HOSPITAL_COMMUNITY): Payer: Self-pay

## 2023-12-23 DIAGNOSIS — R41841 Cognitive communication deficit: Secondary | ICD-10-CM | POA: Diagnosis not present

## 2023-12-23 DIAGNOSIS — R4189 Other symptoms and signs involving cognitive functions and awareness: Secondary | ICD-10-CM | POA: Diagnosis not present

## 2023-12-25 ENCOUNTER — Encounter (HOSPITAL_COMMUNITY)
Admission: RE | Admit: 2023-12-25 | Discharge: 2023-12-25 | Disposition: A | Discharge status: Home / Self Care | Source: Ambulatory Visit | Attending: Cardiovascular Disease | Admitting: Cardiovascular Disease

## 2023-12-25 ENCOUNTER — Ambulatory Visit (HOSPITAL_COMMUNITY)
Admission: RE | Admit: 2023-12-25 | Discharge: 2023-12-25 | Disposition: A | Source: Ambulatory Visit | Attending: Cardiovascular Disease | Admitting: Cardiovascular Disease

## 2023-12-25 ENCOUNTER — Other Ambulatory Visit: Payer: Self-pay

## 2023-12-25 DIAGNOSIS — Z01812 Encounter for preprocedural laboratory examination: Secondary | ICD-10-CM | POA: Insufficient documentation

## 2023-12-25 DIAGNOSIS — Z01818 Encounter for other preprocedural examination: Secondary | ICD-10-CM

## 2023-12-25 DIAGNOSIS — I350 Nonrheumatic aortic (valve) stenosis: Secondary | ICD-10-CM | POA: Insufficient documentation

## 2023-12-25 DIAGNOSIS — I517 Cardiomegaly: Secondary | ICD-10-CM | POA: Diagnosis not present

## 2023-12-25 HISTORY — DX: Cardiac murmur, unspecified: R01.1

## 2023-12-25 LAB — COMPREHENSIVE METABOLIC PANEL WITH GFR
ALT: 16 U/L (ref 0–44)
AST: 26 U/L (ref 15–41)
Albumin: 3.7 g/dL (ref 3.5–5.0)
Alkaline Phosphatase: 69 U/L (ref 38–126)
Anion gap: 12 (ref 5–15)
BUN: 13 mg/dL (ref 8–23)
CO2: 25 mmol/L (ref 22–32)
Calcium: 9.3 mg/dL (ref 8.9–10.3)
Chloride: 104 mmol/L (ref 98–111)
Creatinine, Ser: 1.03 mg/dL — ABNORMAL HIGH (ref 0.44–1.00)
GFR, Estimated: 56 mL/min — ABNORMAL LOW (ref >60)
Glucose, Bld: 111 mg/dL — ABNORMAL HIGH (ref 70–99)
Potassium: 4.2 mmol/L (ref 3.5–5.1)
Sodium: 141 mmol/L (ref 135–145)
Total Bilirubin: 1.2 mg/dL (ref 0.0–1.2)
Total Protein: 7.1 g/dL (ref 6.5–8.1)

## 2023-12-25 LAB — CBC
HCT: 38.5 % (ref 36.0–46.0)
Hemoglobin: 12.1 g/dL (ref 12.0–15.0)
MCH: 28.9 pg (ref 26.0–34.0)
MCHC: 31.4 g/dL (ref 30.0–36.0)
MCV: 91.9 fL (ref 80.0–100.0)
Platelets: 220 K/uL (ref 150–400)
RBC: 4.19 MIL/uL (ref 3.87–5.11)
RDW: 13.5 % (ref 11.5–15.5)
WBC: 6.2 K/uL (ref 4.0–10.5)
nRBC: 0 % (ref 0.0–0.2)

## 2023-12-25 LAB — TYPE AND SCREEN
ABO/RH(D): A POS
Antibody Screen: NEGATIVE

## 2023-12-25 LAB — PROTIME-INR
INR: 1.1 (ref 0.8–1.2)
Prothrombin Time: 15 s (ref 11.4–15.2)

## 2023-12-25 LAB — SURGICAL PCR SCREEN
MRSA, PCR: NEGATIVE
Staphylococcus aureus: NEGATIVE

## 2023-12-25 NOTE — Progress Notes (Addendum)
 All consents signed by patient at PAT lab appointment. Pt was sent home with printed copy of surgical instructions and CHG soap/CHG soap instructions. All instructions reviewed with patient and questions answered.  Patients chart send to anesthesia for review. Pt denies any respiratory illness/infection in the last two months.   Pt unable to give urine sample at lab appointment. Will need to be collected DOS. Order modified.

## 2023-12-28 MED ORDER — POTASSIUM CHLORIDE 2 MEQ/ML IV SOLN
80.0000 meq | INTRAVENOUS | Status: DC
  Filled 2023-12-28: qty 40

## 2023-12-28 MED ORDER — NOREPINEPHRINE 4 MG/250ML-% IV SOLN
0.0000 ug/min | INTRAVENOUS | Status: AC
  Administered 2023-12-29: 2 ug/min via INTRAVENOUS
  Filled 2023-12-28: qty 250

## 2023-12-28 MED ORDER — DEXMEDETOMIDINE HCL IN NACL 400 MCG/100ML IV SOLN
0.1000 ug/kg/h | INTRAVENOUS | Status: AC
  Administered 2023-12-29: 79.4 ug via INTRAVENOUS
  Administered 2023-12-29: 1 ug/kg/h via INTRAVENOUS
  Filled 2023-12-28: qty 100

## 2023-12-28 MED ORDER — CEFAZOLIN SODIUM-DEXTROSE 2-4 GM/100ML-% IV SOLN
2.0000 g | INTRAVENOUS | Status: AC
  Administered 2023-12-29: 2 g via INTRAVENOUS
  Filled 2023-12-28: qty 100

## 2023-12-28 MED ORDER — MAGNESIUM SULFATE 50 % IJ SOLN
40.0000 meq | INTRAMUSCULAR | Status: DC
  Filled 2023-12-28: qty 9.85

## 2023-12-28 MED ORDER — HEPARIN 30,000 UNITS/1000 ML (OHS) CELLSAVER SOLUTION
Status: DC
  Filled 2023-12-28: qty 1000

## 2023-12-29 ENCOUNTER — Encounter (HOSPITAL_COMMUNITY)
Admission: RE | Disposition: A | Discharge status: Skilled Nursing Facility | Source: Home / Self Care | Attending: Cardiovascular Disease

## 2023-12-29 ENCOUNTER — Inpatient Hospital Stay (HOSPITAL_COMMUNITY)
Admission: RE | Admit: 2023-12-29 | Discharge: 2023-12-30 | DRG: 266 | Disposition: A | Discharge status: Skilled Nursing Facility | Attending: Cardiovascular Disease | Admitting: Cardiovascular Disease

## 2023-12-29 ENCOUNTER — Inpatient Hospital Stay (HOSPITAL_COMMUNITY): Admitting: Anesthesiology

## 2023-12-29 ENCOUNTER — Encounter (HOSPITAL_COMMUNITY): Payer: Self-pay | Admitting: Cardiovascular Disease

## 2023-12-29 ENCOUNTER — Inpatient Hospital Stay (HOSPITAL_COMMUNITY)

## 2023-12-29 ENCOUNTER — Inpatient Hospital Stay (HOSPITAL_COMMUNITY): Payer: Self-pay | Admitting: Vascular Surgery

## 2023-12-29 DIAGNOSIS — I350 Nonrheumatic aortic (valve) stenosis: Secondary | ICD-10-CM

## 2023-12-29 DIAGNOSIS — E785 Hyperlipidemia, unspecified: Secondary | ICD-10-CM | POA: Diagnosis present

## 2023-12-29 DIAGNOSIS — I4891 Unspecified atrial fibrillation: Secondary | ICD-10-CM

## 2023-12-29 DIAGNOSIS — I10 Essential (primary) hypertension: Secondary | ICD-10-CM

## 2023-12-29 DIAGNOSIS — Z952 Presence of prosthetic heart valve: Principal | ICD-10-CM

## 2023-12-29 DIAGNOSIS — I447 Left bundle-branch block, unspecified: Secondary | ICD-10-CM | POA: Diagnosis present

## 2023-12-29 DIAGNOSIS — I5023 Acute on chronic systolic (congestive) heart failure: Secondary | ICD-10-CM | POA: Diagnosis present

## 2023-12-29 DIAGNOSIS — Z79899 Other long term (current) drug therapy: Secondary | ICD-10-CM | POA: Diagnosis not present

## 2023-12-29 DIAGNOSIS — I110 Hypertensive heart disease with heart failure: Secondary | ICD-10-CM | POA: Diagnosis present

## 2023-12-29 DIAGNOSIS — Z8673 Personal history of transient ischemic attack (TIA), and cerebral infarction without residual deficits: Secondary | ICD-10-CM

## 2023-12-29 DIAGNOSIS — Z006 Encounter for examination for normal comparison and control in clinical research program: Secondary | ICD-10-CM

## 2023-12-29 DIAGNOSIS — R911 Solitary pulmonary nodule: Secondary | ICD-10-CM | POA: Diagnosis not present

## 2023-12-29 DIAGNOSIS — Z7982 Long term (current) use of aspirin: Secondary | ICD-10-CM | POA: Diagnosis not present

## 2023-12-29 DIAGNOSIS — G3184 Mild cognitive impairment, so stated: Secondary | ICD-10-CM

## 2023-12-29 DIAGNOSIS — I272 Pulmonary hypertension, unspecified: Secondary | ICD-10-CM | POA: Diagnosis present

## 2023-12-29 DIAGNOSIS — Z7951 Long term (current) use of inhaled steroids: Secondary | ICD-10-CM

## 2023-12-29 DIAGNOSIS — F03A0 Unspecified dementia, mild, without behavioral disturbance, psychotic disturbance, mood disturbance, and anxiety: Secondary | ICD-10-CM | POA: Diagnosis present

## 2023-12-29 DIAGNOSIS — I480 Paroxysmal atrial fibrillation: Secondary | ICD-10-CM | POA: Diagnosis present

## 2023-12-29 DIAGNOSIS — Z90710 Acquired absence of both cervix and uterus: Secondary | ICD-10-CM | POA: Diagnosis not present

## 2023-12-29 HISTORY — DX: Paroxysmal atrial fibrillation: I48.0

## 2023-12-29 HISTORY — DX: Mild cognitive impairment of uncertain or unknown etiology: G31.84

## 2023-12-29 HISTORY — DX: Nonrheumatic aortic (valve) stenosis: I35.0

## 2023-12-29 HISTORY — PX: INTRAOPERATIVE TRANSTHORACIC ECHOCARDIOGRAM: SHX6523

## 2023-12-29 HISTORY — DX: Personal history of transient ischemic attack (TIA), and cerebral infarction without residual deficits: Z86.73

## 2023-12-29 HISTORY — DX: Essential (primary) hypertension: I10

## 2023-12-29 LAB — ECHOCARDIOGRAM LIMITED
AR max vel: 0.89 cm2
AV Area VTI: 0.88 cm2
AV Area mean vel: 0.82 cm2
AV Mean grad: 38.3 mmHg
AV Peak grad: 46.1 mmHg
Ao pk vel: 3.4 m/s
Est EF: 30
MV VTI: 1.74 cm2
P 1/2 time: 277 ms

## 2023-12-29 LAB — POCT I-STAT, CHEM 8
BUN: 13 mg/dL (ref 8–23)
Calcium, Ion: 1.22 mmol/L (ref 1.15–1.40)
Chloride: 105 mmol/L (ref 98–111)
Creatinine, Ser: 0.9 mg/dL (ref 0.44–1.00)
Glucose, Bld: 129 mg/dL — ABNORMAL HIGH (ref 70–99)
HCT: 26 % — ABNORMAL LOW (ref 36.0–46.0)
Hemoglobin: 8.8 g/dL — ABNORMAL LOW (ref 12.0–15.0)
Potassium: 3.6 mmol/L (ref 3.5–5.1)
Sodium: 142 mmol/L (ref 135–145)
TCO2: 24 mmol/L (ref 22–32)

## 2023-12-29 LAB — POCT ACTIVATED CLOTTING TIME
Activated Clotting Time: 240 s
Activated Clotting Time: 250 s

## 2023-12-29 LAB — ABO/RH: ABO/RH(D): A POS

## 2023-12-29 SURGERY — TRANSCATHETER AORTIC VALVE REPLACEMENT, TRANSFEMORAL (CATHLAB)
Anesthesia: Monitor Anesthesia Care

## 2023-12-29 MED ORDER — SODIUM CHLORIDE 0.9 % IV SOLN: INTRAVENOUS | Status: AC

## 2023-12-29 MED ORDER — LIDOCAINE HCL (PF) 1 % IJ SOLN
INTRAMUSCULAR | Status: DC | PRN
  Administered 2023-12-29 (×2): 10 mL via INTRADERMAL

## 2023-12-29 MED ORDER — PROTAMINE SULFATE 10 MG/ML IV SOLN
INTRAVENOUS | Status: DC | PRN
  Administered 2023-12-29: 40 mg via INTRAVENOUS
  Administered 2023-12-29: 10 mg via INTRAVENOUS

## 2023-12-29 MED ORDER — LIDOCAINE HCL (PF) 1 % IJ SOLN
INTRAMUSCULAR | Status: AC
  Filled 2023-12-29: qty 30

## 2023-12-29 MED ORDER — SODIUM CHLORIDE 0.9 % IV SOLN: 250.0000 mL | INTRAVENOUS | Status: DC | PRN

## 2023-12-29 MED ORDER — SODIUM CHLORIDE 0.9% FLUSH: 3.0000 mL | INTRAVENOUS | Status: DC | PRN

## 2023-12-29 MED ORDER — CEFAZOLIN SODIUM-DEXTROSE 2-4 GM/100ML-% IV SOLN
2.0000 g | Freq: Three times a day (TID) | INTRAVENOUS | Status: AC
  Administered 2023-12-29 – 2023-12-30 (×2): 2 g via INTRAVENOUS
  Filled 2023-12-29 (×2): qty 100

## 2023-12-29 MED ORDER — HEPARIN (PORCINE) IN NACL 1000-0.9 UT/500ML-% IV SOLN
INTRAVENOUS | Status: DC | PRN
  Administered 2023-12-29: 500 mL

## 2023-12-29 MED ORDER — NITROGLYCERIN IN D5W 200-5 MCG/ML-% IV SOLN: 0.0000 ug/min | INTRAVENOUS | Status: DC

## 2023-12-29 MED ORDER — TRAMADOL HCL 50 MG PO TABS: 50.0000 mg | ORAL_TABLET | ORAL | Status: DC | PRN

## 2023-12-29 MED ORDER — ACETAMINOPHEN 500 MG PO TABS
1000.0000 mg | ORAL_TABLET | Freq: Once | ORAL | Status: AC
  Administered 2023-12-29: 1000 mg via ORAL
  Filled 2023-12-29: qty 2

## 2023-12-29 MED ORDER — MORPHINE SULFATE (PF) 2 MG/ML IV SOLN: 1.0000 mg | INTRAVENOUS | Status: DC | PRN

## 2023-12-29 MED ORDER — ASPIRIN 81 MG PO TBEC
81.0000 mg | DELAYED_RELEASE_TABLET | Freq: Every day | ORAL | Status: DC
  Administered 2023-12-30: 81 mg via ORAL
  Filled 2023-12-29 (×2): qty 1

## 2023-12-29 MED ORDER — SODIUM CHLORIDE 0.9 % IV SOLN: INTRAVENOUS | Status: DC

## 2023-12-29 MED ORDER — OXYCODONE HCL 5 MG PO TABS: 5.0000 mg | ORAL_TABLET | ORAL | Status: DC | PRN

## 2023-12-29 MED ORDER — IOHEXOL 350 MG/ML SOLN
INTRAVENOUS | Status: DC | PRN
  Administered 2023-12-29: 55 mL

## 2023-12-29 MED ORDER — ALBUMIN HUMAN 5 % IV SOLN: 12.5000 g | Freq: Once | INTRAVENOUS | Status: DC

## 2023-12-29 MED ORDER — ATORVASTATIN CALCIUM 10 MG PO TABS
20.0000 mg | ORAL_TABLET | Freq: Every day | ORAL | Status: DC
  Administered 2023-12-30: 20 mg via ORAL
  Filled 2023-12-29 (×2): qty 2

## 2023-12-29 MED ORDER — ACETAMINOPHEN 650 MG RE SUPP: 650.0000 mg | Freq: Four times a day (QID) | RECTAL | Status: DC | PRN

## 2023-12-29 MED ORDER — CHLORHEXIDINE GLUCONATE 4 % EX SOLN: 30.0000 mL | CUTANEOUS | Status: DC

## 2023-12-29 MED ORDER — LACTATED RINGERS IV SOLN: INTRAVENOUS | Status: DC | PRN

## 2023-12-29 MED ORDER — ONDANSETRON HCL 4 MG/2ML IJ SOLN: 4.0000 mg | Freq: Four times a day (QID) | INTRAMUSCULAR | Status: DC | PRN

## 2023-12-29 MED ORDER — SODIUM CHLORIDE 0.9% FLUSH
3.0000 mL | Freq: Two times a day (BID) | INTRAVENOUS | Status: DC
  Administered 2023-12-30: 3 mL via INTRAVENOUS

## 2023-12-29 MED ORDER — ACETAMINOPHEN 325 MG PO TABS: 650.0000 mg | ORAL_TABLET | Freq: Four times a day (QID) | ORAL | Status: DC | PRN

## 2023-12-29 MED ORDER — HEPARIN SODIUM (PORCINE) 1000 UNIT/ML IJ SOLN
INTRAMUSCULAR | Status: DC | PRN
  Administered 2023-12-29: 4000 [IU] via INTRAVENOUS
  Administered 2023-12-29: 12000 [IU] via INTRAVENOUS

## 2023-12-29 MED ORDER — PROPOFOL 500 MG/50ML IV EMUL
INTRAVENOUS | Status: DC | PRN
  Administered 2023-12-29: 25 ug/kg/min via INTRAVENOUS

## 2023-12-29 MED ORDER — CHLORHEXIDINE GLUCONATE 4 % EX SOLN: 60.0000 mL | Freq: Once | CUTANEOUS | Status: DC

## 2023-12-29 MED ORDER — NOREPINEPHRINE 4 MG/250ML-% IV SOLN
0.0000 ug/min | INTRAVENOUS | Status: DC
  Filled 2023-12-29: qty 250

## 2023-12-29 MED ORDER — ALBUMIN HUMAN 5 % IV SOLN
INTRAVENOUS | Status: AC
  Administered 2023-12-29: 12.5 g
  Filled 2023-12-29: qty 250

## 2023-12-29 MED ORDER — CHLORHEXIDINE GLUCONATE 0.12 % MT SOLN
15.0000 mL | Freq: Once | OROMUCOSAL | Status: AC
  Administered 2023-12-29: 15 mL via OROMUCOSAL
  Filled 2023-12-29: qty 15

## 2023-12-29 MED ORDER — CHLORHEXIDINE GLUCONATE 4 % EX SOLN
60.0000 mL | Freq: Once | CUTANEOUS | Status: DC
  Filled 2023-12-29: qty 60

## 2023-12-29 MED ORDER — HEPARIN (PORCINE) IN NACL 2000-0.9 UNIT/L-% IV SOLN
INTRAVENOUS | Status: DC | PRN
  Administered 2023-12-29: 1000 mL

## 2023-12-29 SURGICAL SUPPLY — 32 items
BAG SNAP BAND KOVER 36X36 (MISCELLANEOUS) ×2 IMPLANT
BLANKET WARM UNDERBOD FULL ACC (MISCELLANEOUS) IMPLANT
CABLE ADAPT PACING TEMP 12FT (ADAPTER) IMPLANT
CATH 26 ULTRA DELIVERY (CATHETERS) IMPLANT
CATH DIAG 6FR PIGTAIL ANGLED (CATHETERS) IMPLANT
CATH INFINITI 5FR ANG PIGTAIL (CATHETERS) IMPLANT
CATH INFINITI 6F AL1 (CATHETERS) IMPLANT
CATH S G BIP PACING (CATHETERS) IMPLANT
CLOSURE MYNX CONTROL 6F/7F (Vascular Products) IMPLANT
CLOSURE PERCLOSE PROSTYLE (Vascular Products) IMPLANT
CRIMPER (MISCELLANEOUS) IMPLANT
DEVICE INFLATION ATRION QL2530 (MISCELLANEOUS) IMPLANT
ELECT DEFIB PAD ADLT CADENCE (PAD) IMPLANT
KIT MICROPUNCTURE NIT STIFF (SHEATH) IMPLANT
KIT SAPIAN 3 ULTRA RESILIA 26 (Valve) IMPLANT
KIT SINGLE USE MANIFOLD (KITS) IMPLANT
PACK CARDIAC CATHETERIZATION (CUSTOM PROCEDURE TRAY) ×1 IMPLANT
PAD SORBX EP SHIELD 16.5X12 (MISCELLANEOUS) IMPLANT
SET ATX-X65L (MISCELLANEOUS) IMPLANT
SHEATH BRITE TIP 7FR 35CM (SHEATH) IMPLANT
SHEATH INTRODUCER SET 20-26 (SHEATH) IMPLANT
SHEATH PINNACLE 6F 10CM (SHEATH) IMPLANT
SHEATH PINNACLE 8F 10CM (SHEATH) IMPLANT
SHIELD CATHGARD ARROW (MISCELLANEOUS) IMPLANT
STOPCOCK MORSE 400PSI 3WAY (MISCELLANEOUS) ×2 IMPLANT
TRANSDUCER W/STOPCOCK (MISCELLANEOUS) IMPLANT
TUBING ART PRESS 72 MALE/FEM (TUBING) IMPLANT
WIRE AMPLATZ SS-J .035X180CM (WIRE) IMPLANT
WIRE EMERALD 3MM-J .035X150CM (WIRE) IMPLANT
WIRE EMERALD 3MM-J .035X260CM (WIRE) IMPLANT
WIRE EMERALD ST .035X260CM (WIRE) IMPLANT
WIRE SAFARI SM CURVE 275 (WIRE) IMPLANT

## 2023-12-29 NOTE — Anesthesia Preprocedure Evaluation (Addendum)
 Anesthesia Evaluation  Patient identified by MRN, date of birth, ID band Patient awake    Reviewed: Allergy & Precautions, NPO status , Patient's Chart, lab work & pertinent test results  History of Anesthesia Complications Negative for: history of anesthetic complications  Airway Mallampati: I  TM Distance: >3 FB Neck ROM: Full    Dental  (+) Dental Advisory Given   Pulmonary neg pulmonary ROS   breath sounds clear to auscultation       Cardiovascular hypertension, Pt. on medications pulmonary hypertension(-) angina + dysrhythmias Atrial Fibrillation + Valvular Problems/Murmurs (severe) AS  Rhythm:Regular Rate:Normal + Systolic murmurs 88/7974 ECHO: EF 30 to 35%.  1. The LV has moderately decreased function, global hypokinesis. There is mild concentric LVH.   2. RVF is normal. The right ventricular size is normal. There is severely elevated pulmonary artery systolic pressure.   3. The mitral valve is degenerative. Mild to moderate mitral valve regurgitation. No evidence of mitral stenosis. Severe mitral annular calcification.   4. Tricuspid valve regurgitation is moderate.   5. The aortic valve is tricuspid. There is severe calcifcation of the aortic valve. There is severe thickening of the aortic valve. Aortic valve regurgitation is moderate. Severe aortic valve stenosis. Aortic valve area, by VTI measures 0.51 cm. Aortic   valve mean gradient measures 51.0 mmHg. Aortic valve Vmax measures 4.47  m/s.   11/2023 Cath:  Mild focal plaquing of proximal LAD (10-20% stenosis).  Otherwise, no angiographically significant coronary artery disease. Moderately elevated left heart filling pressures (PCWP 25, LVEDP 30-35 mmHg). Moderate pulmonary hypertension (PA 45/24, mean 31 mmHg). Mildly elevated right heart filling pressure (mean RA 6, RV 45/6 mmHg). Normal Fick cardiac output/index (CO 5.2 L/min, CI 2.8 L/min/m^2). Moderate to severe  aortic valve stenosis (mean gradient 34 mmHg, AVA 0.8 cm^2).     Neuro/Psych TIA   GI/Hepatic negative GI ROS, Neg liver ROS,,,  Endo/Other  negative endocrine ROS    Renal/GU negative Renal ROS     Musculoskeletal   Abdominal   Peds  Hematology Hb 12.1, plt 220k   Anesthesia Other Findings   Reproductive/Obstetrics                              Anesthesia Physical Anesthesia Plan  ASA: 4  Anesthesia Plan: MAC   Post-op Pain Management: Tylenol  PO (pre-op)*   Induction: Intravenous  PONV Risk Score and Plan: 2 and Treatment may vary due to age or medical condition and Ondansetron  Airway Management Planned: Natural Airway and Simple Face Mask  Additional Equipment: None  Intra-op Plan:   Post-operative Plan:   Informed Consent: I have reviewed the patients History and Physical, chart, labs and discussed the procedure including the risks, benefits and alternatives for the proposed anesthesia with the patient or authorized representative who has indicated his/her understanding and acceptance.     Dental advisory given  Plan Discussed with: CRNA and Surgeon  Anesthesia Plan Comments:          Anesthesia Quick Evaluation

## 2023-12-29 NOTE — Discharge Instructions (Signed)

## 2023-12-29 NOTE — Progress Notes (Signed)
 Dr. Marthann called and notified of BP, H and H post op. Albumin started

## 2023-12-29 NOTE — Interval H&P Note (Signed)
 History and Physical Interval Note:  12/29/2023 4:00 PM  Julia Bowman  has presented today for surgery, with the diagnosis of Severe Aortic Stenosis.  The various methods of treatment have been discussed with the patient and family. After consideration of risks, benefits and other options for treatment, the patient has consented to  Procedure(s): Transcatheter Aortic Valve Replacement, Transfemoral (N/A) ECHOCARDIOGRAM, TRANSTHORACIC (N/A) as a surgical intervention.  The patient's history has been reviewed, patient examined, no change in status, stable for surgery.  I have reviewed the patient's chart and labs.  Questions were answered to the patient's satisfaction.     Con RAMAN Atreus Hasz

## 2023-12-29 NOTE — Op Note (Signed)
 HEART AND VASCULAR CENTER   MULTIDISCIPLINARY HEART VALVE TEAM   TAVR OPERATIVE NOTE   Date of Procedure:  12/29/2023  Preoperative Diagnosis: Severe Aortic Stenosis   Postoperative Diagnosis: Same   Procedure:   Transcatheter Aortic Valve Replacement - Percutaneous Transfemoral Approach  Edwards Sapien 3 Ultra Resilia (size 26 mm, model # [id number] , serial # [id number])   Co-Surgeons:  Con Clunes, MD and Ozell Fell, MD   Anesthesiologist:  Leonce, MD  Echocardiographer:  Delford, MD  Pre-operative Echo Findings: Severe aortic stenosis Severe left ventricular systolic function  Post-operative Echo Findings: No paravalvular leak Improved left ventricular systolic function   BRIEF CLINICAL NOTE AND INDICATIONS FOR SURGERY  Julia Bowman is a 78 y.o. female presents for evaluation of severe aortic stenosis and TAVR.  Her symptoms are mainly dyspnea on exertion, and she said she breathes fairly well on a great day.  She lives in a retirement community [employer]) and is independent in her ADLs.  She was admitted in early November with decompensated heart failure, and since then has been using a walker for ambulation.   She does have some mild memory impairments and saw a neurologist a month ago who did not recommend any intervention  She is somewhat active and enjoys playing chair volleyball and moving around as much as possible.   DETAILS OF THE OPERATIVE PROCEDURE  PREPARATION:    The patient was brought to the operating room on the above mentioned date and appropriate monitoring was established by the anesthesia team. The patient was placed in the supine position on the operating table.  Intravenous antibiotics were administered. The patient was monitored closely throughout the procedure under conscious sedation.  Baseline transthoracic echocardiogram was performed. The patient's abdomen and both groins were prepped and draped in a sterile manner. A time out  procedure was performed.   PERIPHERAL ACCESS:    Using the modified Seldinger technique, femoral arterial and venous access was obtained with placement of 6 Fr sheaths on the left side.  A pigtail diagnostic catheter was passed through the left femoral arterial sheath under fluoroscopic guidance into the aortic root.  A temporary transvenous pacemaker catheter was passed through the left femoral venous sheath under fluoroscopic guidance into the right ventricle.  The pacemaker was tested to ensure stable lead placement and pacemaker capture. Aortic root angiography was performed in order to determine the optimal angiographic angle for valve deployment.   TRANSFEMORAL ACCESS:   Percutaneous transfemoral access and sheath placement was performed using ultrasound guidance.  The right common femoral artery was cannulated using a micropuncture needle and appropriate location was verified using hand injection angiogram.  A pair of Abbott Perclose percutaneous closure devices were placed and a 6 French sheath replaced into the femoral artery.  The patient was heparinized systemically and ACT verified > 250 seconds.    A 14 Fr transfemoral E-sheath was introduced into the right common femoral artery after progressively dilating over an Amplatz superstiff wire. An AL-2 catheter was used to direct a straight-tip exchange length wire across the native aortic valve into the left ventricle. This was exchanged out for a pigtail catheter and position was confirmed in the LV apex. Simultaneous LV and Ao pressures were recorded.  The LVEDP was 29 mmHg.  The pigtail catheter was exchanged for a Safari wire in the LV apex.   TRANSCATHETER HEART VALVE DEPLOYMENT:   An Edwards Sapien 3 Ultra transcatheter heart valve (26 mm) was prepared and crimped per manufacturer's  guidelines, and the proper orientation of the valve was confirmed on the [employer] delivery system. The valve was advanced through the introducer  sheath using normal technique until in an appropriate position in the abdominal aorta beyond the sheath tip. The balloon was then retracted and using the fine-tuning wheel was centered on the valve. The valve was then advanced across the aortic arch using appropriate flexion of the catheter. The valve was carefully positioned across the aortic valve annulus. The Commander catheter was retracted using normal technique. Once final position of the valve was confirmed by angiographic assessment, the valve was deployed during rapid ventricular pacing to maintain systolic blood pressure < 50 mmHg and pulse pressure < 10 mmHg. The balloon inflation was held for >3 seconds after reaching full deployment volume. Once the balloon was fully deflated the balloon was retracted into the ascending aorta and valve function was assessed using echocardiography. There was felt to be no paravalvular leak and no central aortic insufficiency.  The patient's hemodynamic recovery following valve deployment was good.  The deployment balloon and guidewire were both removed.    PROCEDURE COMPLETION:   The sheath was removed and femoral artery closure performed.  Protamine was administered once femoral arterial repair was complete. The temporary pacemaker, pigtail catheter and femoral sheaths were removed with manual pressure used for venous hemostasis.  A Mynx femoral closure device was utilized following removal of the diagnostic sheath in the left femoral artery.  The patient tolerated the procedure well and was transported to the cath lab recovery area in stable condition. There were no immediate intraoperative complications. All sponge instrument and needle counts were verified correct at completion of the operation.   No blood products were administered during the operation.  The patient received a total of 55 mL of intravenous contrast during the procedure.   Con GORMAN Clunes, MD 12/29/2023 5:48 PM

## 2023-12-29 NOTE — Discharge Summary (Incomplete)
 HEART AND VASCULAR CENTER   MULTIDISCIPLINARY HEART VALVE TEAM  Discharge Summary    Patient ID: Julia Bowman MRN: [id number]; DOB: May 14, 1945  Admit date: 12/29/2023 Discharge date: 12/30/2023  PCP:  Feliciano Devoria LABOR, MD  [facility] Cardiologist:  Annabella Scarce, MD  [facility] Structural heart: Ozell Fell, MD [facility] Electrophysiologist:  None   Discharge Diagnoses    Principal Problem:   S/P TAVR (transcatheter aortic valve replacement) Active Problems:   Severe aortic stenosis   History of TIA (transient ischemic attack)   Hypertension   Mild cognitive impairment   PAF (paroxysmal atrial fibrillation) (HCC)   Acute on chronic HFrEF (heart failure with reduced ejection fraction) (HCC)   Allergies No Known Allergies  Diagnostic Studies/Procedures    TAVR OPERATIVE NOTE     Date of Procedure:                12/29/2023   Preoperative Diagnosis:      Severe Aortic Stenosis    Postoperative Diagnosis:    Same    Procedure:        Transcatheter Aortic Valve Replacement - Percutaneous Transfemoral Approach             Edwards Sapien 3 Ultra Resilia THV (size 26 mm, serial # [id number])              Co-Surgeons:                        Con Clunes, MD and Ozell Fell, MD   Anesthesiologist:                  Kelly Mace, MD   Echocardiographer:              Maude Emmer, MD   Pre-operative Echo Findings: Severe aortic stenosis Severe left ventricular systolic dysfunction   Post-operative Echo Findings: No paravalvular leak Unchanged left ventricular systolic function _____________    Echo 12/30/23:  IMPRESSIONS   1. Left ventricular ejection fraction, by estimation, is 40 to 45%. Left  ventricular ejection fraction by PLAX is 40 %. The left ventricle has  mildly decreased function. The left ventricle has no regional wall motion  abnormalities. There is severe  concentric left ventricular hypertrophy. Left ventricular diastolic   function could not be evaluated.   2. Right ventricular systolic function is normal. The right ventricular  size is normal. There is moderately elevated pulmonary artery systolic  pressure. The estimated right ventricular systolic pressure is 56.5 mmHg.   3. Left atrial size was severely dilated.   4. A small pericardial effusion is present.   5. The mitral valve is degenerative. Mild mitral valve regurgitation. No  evidence of mitral stenosis. Moderate mitral annular calcification.   6. Well seated prosthetic valve with leaflet not well seen and normal  function (PV 2.1 m/sec, MG 10 mmHg, DVI 0.58) and mild transvalvular  aortic regurgitation. The aortic valve has been repaired/replaced. Aortic  valve regurgitation is not visualized.  No aortic stenosis is present. There is a 26 mm Edwards Sapien prosthetic  (TAVR) valve present in the aortic position.   7. There is an hyperechoic structure in the IVC near the RA IVC junction.  The inferior vena cava is dilated in size with <50% respiratory  variability, suggesting right atrial pressure of 15 mmHg.   Comparison(s): Prior images reviewed side by side. MR, TR, and EF have  improved and there is now prosthetic AV in  place.   History of Present Illness     Julia Bowman is a 78 y.o. female with a history of HLD, mild cognitive impairment, TIA, PAF, and HFrEF with recent admission and critical AS who presented to [facility] on 12/29/23 for planned TAVR.   Admitted 07/2023 with possible TIA. Seen by neurology who felt that imaging was inconsistent with symptoms. Discharged on aspirin . Echo showed EF 50-55% and severe AS. She was referred to cardiology. Cardiac monitor in 09/2023 showed no atrial fibrillation.   She was readmitted 11/4-11/6/25 for new onset acute CHF. Limited echo 11/5 showed new LV dysfunction with EF 30% and critical AS with mean grad 51 mmHg, Vmax 4.5 m/s, AVA 0.51, mod MR/TR/AI. She responded well to a single dose of IV  furosemide . Cardiac catheterization 12/03/23 showed patent coronary arteries with no significant CAD. Mean PA pressure was mildly elevated at 31 mmHg, cardiac output preserved with an index of 2.8. She was discharged on furosemide  20 mg daily. There was also a question of possible PAF on admission but no recurrence on telemetry. OAC was deferred.   The patient was evaluated by the multidisciplinary valve team and felt to have severe, symptomatic aortic stenosis and to be a suitable candidate for TAVR, which was set up for 12/29/23.   Hospital Course     Consultants: none   Severe AS:  -- S/p TAVR with a 26 mm Edwards Sapien 3 Ultra Resilia THV via the TF approach on 12/29/23.  -- Post operative echo showed EF 40-45%, mod pulm HTN, small pericardial effusion, mod MAC with mild MR, normally functioning TAVR with a mean gradient of 10 mmHg and mild central AI. -- Groin sites are stable.  -- Continue Asprin 81mg  daily.  -- Met with cardiac rehab to discuss CRP phase II.  -- Plan for discharge home today with close follow up in the outpatient setting.   Acute on chronic HFrEF: -- LVEDP 29 mm hg at the time of TAVR.  -- Treated with IV lasix  40mg  and KCL 20meq x1.  -- EF improved to 40-45% on POD 1 echo.  -- GDMT previously limited by soft BPs and severe AS.  -- BP improved 116/66 on last check.  -- Will start losartan 25mg  daily. If EF does not normalize and BP tolerates it, will transition her to [facility] in the outpatient setting.  -- Increase home Lasix  from 20mg  to 40mg  daily.  -- BMET at follow up.   New LBBB: -- Discharge with Zio AT to rule out delayed HAVB.  PAF: -- I reviewed ECGs and tracing 12/01/23 showed afib vs sinus with baseline artifact/PVCs. -- Has evidence of infarcts on MRI although it didn't correlate with presentation at time of possible TIA in 07/2023.  -- Reviewed with Dr. Inocencio with EP and repeat monitor was recommended to monitor for PAF. -- As above, Zio AT at  discharge.  HTN: -- BP improved s/p TAVR. -- As above, start losartan 25mg  daily and increase Lasix  from 20mg  to 40mg  daily.  -- BMET at follow up.   Post op anemia: -- Hg dropped from 12.1--> 9.6. Expected post operatively.  -- No s/s acute blood loss.   Pulmonary nodule: -- Pre TAVR CTA noted a 4 mm right solid pulmonary nodule within the upper lobe. No follow-up needed if patient is low-risk. Cardiac CT over-read by a different radiologist noted that the 6 mm right lower lobe nodule dating back to 11/22/2019, likely benign. -- This will be discussed  with the pt in the outpatient setting.    _____________  Discharge Vitals Blood pressure 116/66, pulse 72, temperature 97.9 F (36.6 C), temperature source Oral, resp. rate 16, height 5' 5 (1.651 m), weight 77.6 kg, SpO2 100%.  Filed Weights   12/29/23 1247  Weight: 77.6 kg     GEN: Well nourished, well developed in no acute distress NECK: No JVD CARDIAC: RRR, no murmurs, rubs, gallops RESPIRATORY:  Clear to auscultation without rales, wheezing or rhonchi  ABDOMEN: Soft, non-tender, non-distended EXTREMITIES:  No edema; No deformity.  Groin sites clear without hematoma or ecchymosis.    Disposition   Pt is being discharged home today in good condition.  Follow-up Plans & Appointments     Follow-up Information     Sebastian Lamarr SAUNDERS, PA-C. Go on 01/04/2024.   Specialties: Cardiology, Radiology Why: @ 12 noon. please arrive at least 20 minutes early Contact information: [address] [place] [id number] [phone]                Discharge Instructions     Amb Referral to Cardiac Rehabilitation   Complete by: As directed    Diagnosis: Valve Replacement   Valve: Aortic Comment - tavr   After initial evaluation and assessments completed: Virtual Based Care may be provided alone or in conjunction with Phase 2 Cardiac Rehab based on patient barriers.: Yes   Intensive Cardiac Rehabilitation (ICR)  MC location only OR Traditional Cardiac Rehabilitation (TCR) *If criteria for ICR are not met will enroll in TCR [facility] only): Yes       Discharge Medications   Allergies as of 12/30/2023   No Known Allergies      Medication List     TAKE these medications    aspirin  EC 81 MG tablet Take 1 tablet (81 mg total) by mouth daily. Swallow whole.   atorvastatin  20 MG tablet Commonly known as: LIPITOR Take 1 tablet (20 mg total) by mouth daily.   cyanocobalamin  1000 MCG/ML injection Commonly known as: VITAMIN B12 Inject 1,000 mcg into the skin every 30 (thirty) days.   fluticasone  50 MCG/ACT nasal spray Commonly known as: FLONASE  Place 2 sprays into both nostrils daily. 2 sprays each nostril at night   furosemide  40 MG tablet Commonly known as: Lasix  Take 1 tablet (40 mg total) by mouth daily. What changed: how much to take   hydroxypropyl methylcellulose / hypromellose 2.5 % ophthalmic solution Commonly known as: ISOPTO TEARS / GONIOVISC Place 1 drop into both eyes 4 (four) times daily as needed for dry eyes.   losartan 25 MG tablet Commonly known as: Cozaar Take 1 tablet (25 mg total) by mouth daily.   multivitamin with minerals Tabs tablet Take 1 tablet by mouth daily.   triamcinolone  55 MCG/ACT Aero nasal inhaler Commonly known as: NASACORT  Place 2 sprays into the nose daily. 2 sprays each nostril at night          Outstanding Labs/Studies   BMET  ______________________  Duration of Discharge Encounter: APP Time: 31 minutes    Signed, Lamarr Sebastian, PA-C 12/30/2023, 12:32 PM [phone]

## 2023-12-29 NOTE — Op Note (Signed)
 HEART AND VASCULAR CENTER   MULTIDISCIPLINARY HEART VALVE TEAM   TAVR OPERATIVE NOTE   Date of Procedure:  12/29/2023  Preoperative Diagnosis: Severe Aortic Stenosis   Postoperative Diagnosis: Same   Procedure:   Transcatheter Aortic Valve Replacement - Percutaneous Transfemoral Approach  Edwards Sapien 3 Ultra Resilia THV (size 26 mm, serial # [id number])   Co-Surgeons:  Con Clunes, MD and Ozell Fell, MD  Anesthesiologist:  Kelly Mace, MD  Echocardiographer:  Maude Emmer, MD  Pre-operative Echo Findings: Severe aortic stenosis Severe left ventricular systolic dysfunction  Post-operative Echo Findings: No paravalvular leak Unchanged left ventricular systolic function  BRIEF CLINICAL NOTE AND INDICATIONS FOR SURGERY  78 yo woman with mild dementia who has developed severe AS complicated by congestive heart failure and new LV dysfunction. After multidisciplinary review of her case and multimodality imaging studies, she is felt to be an appropriate candidate for TAVR via a transfemoral approach.   During the course of the patient's preoperative work up they have been evaluated comprehensively by a multidisciplinary team of specialists coordinated through the Multidisciplinary Heart Valve Clinic in the [facility].  They have been demonstrated to suffer from symptomatic severe aortic stenosis as noted above. The patient has been counseled extensively as to the relative risks and benefits of all options for the treatment of severe aortic stenosis including long term medical therapy, conventional surgery for aortic valve replacement, and transcatheter aortic valve replacement.  The patient has been independently evaluated in formal cardiac surgical consultation by Dr Clunes, who deemed the patient appropriate for TAVR. Based upon review of all of the patient's preoperative diagnostic tests they are felt to be candidate for transcatheter aortic valve  replacement using the transfemoral approach as an alternative to conventional surgery.    Following the decision to proceed with transcatheter aortic valve replacement, a discussion has been held regarding what types of management strategies would be attempted intraoperatively in the event of life-threatening complications, including whether or not the patient would be considered a candidate for the use of cardiopulmonary bypass and/or conversion to open sternotomy for attempted surgical intervention.  The patient has been advised of a variety of complications that might develop peculiar to this approach including but not limited to risks of death, stroke, paravalvular leak, aortic dissection or other major vascular complications, aortic annulus rupture, device embolization, cardiac rupture or perforation, acute myocardial infarction, arrhythmia, heart block or bradycardia requiring permanent pacemaker placement, congestive heart failure, respiratory failure, renal failure, pneumonia, infection, other late complications related to structural valve deterioration or migration, or other complications that might ultimately cause a temporary or permanent loss of functional independence or other long term morbidity.  The patient provides full informed consent for the procedure as described and all questions were answered preoperatively.  DETAILS OF THE OPERATIVE PROCEDURE  PREPARATION:   The patient is brought to the operating room on the above mentioned date and central monitoring was established by the anesthesia team. The patient is placed in the supine position on the operating table.  Intravenous antibiotics are administered. The patient is monitored closely throughout the procedure under conscious sedation.  Baseline transthoracic echocardiogram is performed. The patient's chest, abdomen, both groins, and both lower extremities are prepared and draped in a sterile manner. A time out procedure is  performed.   PERIPHERAL ACCESS:   Using ultrasound guidance, femoral arterial and venous access is obtained with placement of 6 Fr sheaths on the left side.  US  images are  digitally captured and stored in the patient's chart. A pigtail diagnostic catheter was passed through the femoral arterial sheath under fluoroscopic guidance into the aortic root.  A temporary transvenous pacemaker catheter was passed through the femoral venous sheath under fluoroscopic guidance into the right ventricle.  The pacemaker was tested to ensure stable lead placement and pacemaker capture. Aortic root angiography was performed in order to determine the optimal angiographic angle for valve deployment.  TRANSFEMORAL ACCESS:  A micropuncture technique is used to access the right femoral artery under fluoroscopic and ultrasound guidance.  2 Perclose devices are deployed at 10' and 2' positions to 'PreClose' the femoral artery. An 8 French sheath is placed and then an Amplatz Superstiff wire is advanced through the sheath. This is changed out for a 14 French transfemoral E-Sheath after progressively dilating over the Superstiff wire.  An AL-1 catheter was used to direct a straight-tip exchange length wire across the native aortic valve into the left ventricle. This was exchanged out for a pigtail catheter and position was confirmed in the LV apex. Simultaneous LV and Ao pressures were recorded.  The pigtail catheter was exchanged for a Safari wire in the LV apex.    BALLOON AORTIC VALVULOPLASTY:  Not performed  TRANSCATHETER HEART VALVE DEPLOYMENT:  An Edwards Sapien 3 Ultra Resilia transcatheter heart valve (size 23 mm) was prepared and crimped per manufacturer's guidelines, and the proper orientation of the valve is confirmed on the [employer] delivery system. The valve was advanced through the introducer sheath using normal technique until in an appropriate position in the abdominal aorta beyond the sheath tip. The  balloon was then retracted and using the fine-tuning wheel was centered on the valve. The valve was then advanced across the aortic arch using appropriate flexion of the catheter. The valve was carefully positioned across the aortic valve annulus. The Commander catheter was retracted using normal technique. Once final position of the valve has been confirmed by angiographic assessment, the valve is deployed while temporarily holding ventilation and during rapid ventricular pacing to maintain systolic blood pressure < 50 mmHg and pulse pressure < 10 mmHg. The balloon inflation is held for >3 seconds after reaching full deployment volume. Once the balloon has fully deflated the balloon is retracted into the ascending aorta and valve function is assessed using echocardiography. The patient's hemodynamic recovery following valve deployment is good.  The deployment balloon and guidewire are both removed. Echo demostrated acceptable post-procedural gradients, stable mitral valve function, and no aortic insufficiency.    PROCEDURE COMPLETION:  The sheath was removed and femoral artery closure is performed using the 2 previously deployed Perclose devices.  Protamine is administered once femoral arterial repair was complete. The site is clear with no evidence of bleeding or hematoma after the sutures are tightened. The temporary pacemaker and pigtail catheters are removed. Mynx closure is used for contralateral femoral arterial hemostasis for the 6 Fr sheath.  The patient tolerated the procedure well and is transported to the recovery area in stable condition. There were no immediate intraoperative complications. All sponge instrument and needle counts are verified correct at completion of the operation.   The patient received a total of 55 mL of intravenous contrast during the procedure.  EBL: minimal  LVEDP: 29 mmHg   Ozell Fell, MD 12/29/2023 6:04 PM

## 2023-12-29 NOTE — Transfer of Care (Signed)
 Immediate Anesthesia Transfer of Care Note  Patient: Julia Bowman  Procedure(s) Performed: Transcatheter Aortic Valve Replacement, Transfemoral ECHOCARDIOGRAM, TRANSTHORACIC  Patient Location: Cath Lab  Anesthesia Type:MAC  Level of Consciousness: drowsy  Airway & Oxygen Therapy: Patient Spontanous Breathing and Patient connected to face mask oxygen  Post-op Assessment: Report given to RN and Post -op Vital signs reviewed and stable  Post vital signs: Reviewed and stable  Last Vitals:  Vitals Value Taken Time  BP 90/54 12/29/23 17:45  Temp 36 C 12/29/23 17:40  Pulse 65 12/29/23 17:48  Resp 14 12/29/23 17:48  SpO2 99 % 12/29/23 17:48  Vitals shown include unfiled device data.  Last Pain:  Vitals:   12/29/23 1740  TempSrc: Temporal  PainSc: Asleep         Complications: There were no known notable events for this encounter.

## 2023-12-30 ENCOUNTER — Inpatient Hospital Stay (HOSPITAL_COMMUNITY)
Admission: RE | Admit: 2023-12-30 | Discharge: 2023-12-30 | Disposition: A | Source: Home / Self Care | Attending: Physician Assistant

## 2023-12-30 ENCOUNTER — Inpatient Hospital Stay (HOSPITAL_COMMUNITY)

## 2023-12-30 ENCOUNTER — Encounter (HOSPITAL_COMMUNITY): Payer: Self-pay

## 2023-12-30 DIAGNOSIS — Z952 Presence of prosthetic heart valve: Secondary | ICD-10-CM

## 2023-12-30 DIAGNOSIS — I480 Paroxysmal atrial fibrillation: Secondary | ICD-10-CM

## 2023-12-30 DIAGNOSIS — I5023 Acute on chronic systolic (congestive) heart failure: Secondary | ICD-10-CM | POA: Insufficient documentation

## 2023-12-30 DIAGNOSIS — I447 Left bundle-branch block, unspecified: Secondary | ICD-10-CM

## 2023-12-30 LAB — CBC
HCT: 29.7 % — ABNORMAL LOW (ref 36.0–46.0)
Hemoglobin: 9.6 g/dL — ABNORMAL LOW (ref 12.0–15.0)
MCH: 29 pg (ref 26.0–34.0)
MCHC: 32.3 g/dL (ref 30.0–36.0)
MCV: 89.7 fL (ref 80.0–100.0)
Platelets: 156 K/uL (ref 150–400)
RBC: 3.31 MIL/uL — ABNORMAL LOW (ref 3.87–5.11)
RDW: 13.3 % (ref 11.5–15.5)
WBC: 4.8 K/uL (ref 4.0–10.5)
nRBC: 0 % (ref 0.0–0.2)

## 2023-12-30 LAB — ECHOCARDIOGRAM COMPLETE
AR max vel: 2.19 cm2
AV Area VTI: 2.05 cm2
AV Area mean vel: 2.36 cm2
AV Mean grad: 9.5 mmHg
AV Peak grad: 17.7 mmHg
Ao pk vel: 2.1 m/s
Area-P 1/2: 2.86 cm2
Calc EF: 36.7 %
Height: 65 in
MV VTI: 1.99 cm2
S' Lateral: 4.5 cm
Single Plane A2C EF: 33.8 %
Single Plane A4C EF: 36 %
Weight: 2736 [oz_av]

## 2023-12-30 LAB — BASIC METABOLIC PANEL WITH GFR
Anion gap: 8 (ref 5–15)
BUN: 13 mg/dL (ref 8–23)
CO2: 23 mmol/L (ref 22–32)
Calcium: 8.5 mg/dL — ABNORMAL LOW (ref 8.9–10.3)
Chloride: 111 mmol/L (ref 98–111)
Creatinine, Ser: 0.68 mg/dL (ref 0.44–1.00)
GFR, Estimated: >60 mL/min (ref >60)
Glucose, Bld: 95 mg/dL (ref 70–99)
Potassium: 3.9 mmol/L (ref 3.5–5.1)
Sodium: 142 mmol/L (ref 135–145)

## 2023-12-30 LAB — MAGNESIUM: Magnesium: 1.9 mg/dL (ref 1.7–2.4)

## 2023-12-30 MED ORDER — POTASSIUM CHLORIDE CRYS ER 20 MEQ PO TBCR
20.0000 meq | EXTENDED_RELEASE_TABLET | Freq: Once | ORAL | Status: AC
  Administered 2023-12-30: 20 meq via ORAL
  Filled 2023-12-30: qty 1

## 2023-12-30 MED ORDER — LOSARTAN POTASSIUM 25 MG PO TABS: 25.0000 mg | ORAL_TABLET | Freq: Every day | ORAL | 11 refills | Status: AC

## 2023-12-30 MED ORDER — FUROSEMIDE 40 MG PO TABS: 40.0000 mg | ORAL_TABLET | Freq: Every day | ORAL | 11 refills | Status: AC

## 2023-12-30 MED ORDER — FUROSEMIDE 10 MG/ML IJ SOLN
40.0000 mg | Freq: Once | INTRAMUSCULAR | Status: AC
  Administered 2023-12-30: 40 mg via INTRAVENOUS
  Filled 2023-12-30: qty 4

## 2023-12-30 NOTE — NC FL2 (Signed)
 [place]  MEDICAID [id number] LEVEL OF CARE FORM     IDENTIFICATION  Patient Name: Julia Bowman Birthdate: 08-15-1945 Sex: female Admission Date (Current Location): 12/29/2023  [facility] and [place] Number:  Producer, Television/film/video and Address:  [facility] [place]. [facility], 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   History of TIA (transient ischemic attack)    Hypertension    Mild cognitive impairment    PAF (paroxysmal atrial fibrillation) (HCC)    Severe aortic stenosis 12/02/2023    Orientation RESPIRATION BLADDER Height & Weight     Self, Time, Situation, Place    Continent Weight: 171 lb (77.6 kg) Height:  5' 5 (165.1 cm)  BEHAVIORAL SYMPTOMS/MOOD NEUROLOGICAL BOWEL NUTRITION STATUS      Continent Diet (please see discharge summary)  AMBULATORY STATUS COMMUNICATION OF NEEDS Skin   Limited Assist Verbally Other (Comment) (at risk for pressure injury)                       Personal Care Assistance Level of Assistance  Bathing, Feeding, Dressing Bathing Assistance: Limited assistance Feeding assistance: Independent Dressing Assistance: Limited assistance     Functional Limitations Info  Sight, Hearing, Speech Sight Info: Adequate Hearing Info: Adequate Speech Info: Adequate    SPECIAL CARE FACTORS FREQUENCY  PT (By licensed PT), OT (By licensed OT)     PT Frequency: 5x per week OT Frequency: 5x per week            Contractures Contractures Info: Not present    Additional Factors Info  Code  Status, Allergies Code Status Info: FULL Allergies Info: NKA           Current Medications (12/30/2023):  This is the current hospital active medication list Current Facility-Administered Medications  Medication Dose Route Frequency Provider Last Rate Last Admin   0.9 %  sodium chloride  infusion  250 mL Intravenous PRN Thompson, Kathryn R, PA-C       acetaminophen  (TYLENOL ) tablet 650 mg  650 mg Oral Q6H PRN Thompson, Kathryn R, PA-C       Or   acetaminophen  (TYLENOL ) suppository 650 mg  650 mg Rectal Q6H PRN Thompson, Kathryn R, PA-C       albumin human 5 % solution 12.5 g  12.5 g Intravenous Once Hodierne, Adam, MD       aspirin  EC tablet 81 mg  81 mg Oral Daily Thompson, Kathryn R, PA-C   81 mg at 12/30/23 0831   atorvastatin  (LIPITOR) tablet 20 mg  20 mg Oral Daily Thompson, Kathryn R, PA-C   20 mg at 12/30/23 0831   morphine (PF) 2 MG/ML injection 1-4 mg  1-4 mg Intravenous Q1H PRN Thompson, Kathryn R, PA-C       nitroGLYCERIN 50 mg in dextrose 5 % 250 mL (0.2 mg/mL) infusion  0-100 mcg/min Intravenous Titrated Sebastian Lamarr SAUNDERS, PA-C       norepinephrine (LEVOPHED) 4mg  in (0.016 mg/mL) premix infusion  0-10 mcg/min Intravenous Titrated  Sebastian Lamarr SAUNDERS, PA-C       ondansetron [facility]) injection 4 mg  4 mg Intravenous Q6H PRN Sebastian Lamarr SAUNDERS, PA-C       oxyCODONE (Oxy IR/ROXICODONE) immediate release tablet 5-10 mg  5-10 mg Oral Q3H PRN Thompson, Kathryn R, PA-C       sodium chloride  flush (NS) 0.9 % injection 3 mL  3 mL Intravenous Q12H Sebastian Lamarr SAUNDERS, PA-C   3 mL at 12/30/23 9167   sodium chloride  flush (NS) 0.9 % injection 3 mL  3 mL Intravenous PRN Thompson, Kathryn R, PA-C       traMADol DANNY) tablet 50-100 mg  50-100 mg Oral Q4H PRN Thompson, Kathryn R, PA-C         Discharge Medications: Please see discharge summary for a list of discharge medications.  Relevant Imaging Results:  Relevant Lab Results:   Additional Information SSN  [id number]  Montie LOISE Louder, LCSW

## 2023-12-30 NOTE — TOC Progression Note (Signed)
 Transition of Care [facility]) - Progression Note    Patient Details  Name: Julia Bowman MRN: [id number] Date of Birth: Nov 06, 1945  Transition of Care [facility]) CM/SW Contact  Julia Bowman, [place] Phone Number: 12/30/2023, 10:51 AM  Clinical Narrative:     Received call from [facility]- reports patient is from ILF but they have plans for patient to d/c to SNF and requested FL2.   Julia Bowman, MSW, LCSW Clinical Social Worker                      Expected Discharge Plan and Services                                               Social Drivers of Health (SDOH) Interventions SDOH Screenings   Food Insecurity: No Food Insecurity (12/30/2023)  Housing: Low Risk  (12/30/2023)  Transportation Needs: No Transportation Needs (12/30/2023)  Utilities: Not At Risk (12/30/2023)  Social Connections: Moderately Integrated (12/30/2023)  Tobacco Use: Unknown (12/29/2023)    Readmission Risk Interventions    08/13/2023    1:24 PM  Readmission Risk Prevention Plan  Post Dischage Appt Complete  Medication Screening Complete  Transportation Screening Complete

## 2023-12-30 NOTE — Progress Notes (Addendum)
Patient given discharge instructions. PIVs removed. Telemetry box removed, CCMD notified. Patient taken to vehicle in wheelchair by staff.  Nickalaus Crooke L Kenae Lindquist, RN    

## 2023-12-30 NOTE — Progress Notes (Signed)
 Report given to nurse at [facility] at [facility]. [phone]. All questions answered.

## 2023-12-30 NOTE — TOC Transition Note (Signed)
 Transition of Care [facility]) - Discharge Note   Patient Details  Name: Julia Bowman MRN: [id number] Date of Birth: 19-Feb-1945  [facility]) CM/SW Contact:  Whitfield Campanile, Student-Social Work Phone Number: 12/30/2023, 3:29 PM   Clinical Narrative:     Patient will Discharge to: [facility] SN[facility]/ALF   Discharge Date: 12/30/2023  Family Notified: Family/friend at bedside  Transport By: Family/ friend     Per MD patient is ready for discharge. RN, patient, and facility notified of discharge. Discharge Summary sent to facility. RN given number for report. [phone]. Ambulance transport requested for patient.     Social Work Administrator, arts off.  Whitfield Campanile, MSW Intern       Patient Goals and CMS Choice            Discharge Placement                       Discharge Plan and Services Additional resources added to the After Visit Summary for                                       Social Drivers of Health (SDOH) Interventions SDOH Screenings   Food Insecurity: No Food Insecurity (12/30/2023)  Housing: Low Risk  (12/30/2023)  Transportation Needs: No Transportation Needs (12/30/2023)  Utilities: Not At Risk (12/30/2023)  Social Connections: Moderately Integrated (12/30/2023)  Tobacco Use: Unknown (12/29/2023)     Readmission Risk Interventions    08/13/2023    1:24 PM  Readmission Risk Prevention Plan  Post Dischage Appt Complete  Medication Screening Complete  Transportation Screening Complete

## 2023-12-30 NOTE — Progress Notes (Signed)
 Discussed with pt and friends restrictions, exercise guidelines, and CRPII. Pt receptive. Will refer to [facility] CRPII.  [phone] Aliene Aris BS, ACSM-CEP 12/30/2023 12:18 PM

## 2023-12-30 NOTE — Progress Notes (Signed)
 Mobility Specialist Progress Note;   12/30/23 1043  Mobility  Activity Ambulated with assistance  Level of Assistance Standby assist, set-up cues, supervision of patient - no hands on  Assistive Device Front wheel walker  Distance Ambulated (ft) 250 ft  Activity Response Tolerated well  Mobility Referral Yes  Mobility visit 1 Mobility  Mobility Specialist Start Time (ACUTE ONLY) 1043  Mobility Specialist Stop Time (ACUTE ONLY) 1053  Mobility Specialist Time Calculation (min) (ACUTE ONLY) 10 min   Pt agreeable to mobility. Required no physical assistance during ambulation, SV for safety. VSS throughout. C/o mild SOB, however SPO2 WFL. No bleeding noted. Pt returned back to bed and left with all needs met. Alarm on.   Lauraine Erm Mobility Specialist Please contact via SecureChat or [employer] [phone]

## 2023-12-30 NOTE — Anesthesia Postprocedure Evaluation (Signed)
 Anesthesia Post Note  Patient: Julia Bowman  Procedure(s) Performed: Transcatheter Aortic Valve Replacement, Transfemoral ECHOCARDIOGRAM, TRANSTHORACIC     Patient location during evaluation: PACU Anesthesia Type: MAC Level of consciousness: awake and alert Pain management: pain level controlled Vital Signs Assessment: post-procedure vital signs reviewed and stable Respiratory status: spontaneous breathing, nonlabored ventilation, respiratory function stable and patient connected to nasal cannula oxygen Cardiovascular status: stable and blood pressure returned to baseline Postop Assessment: no apparent nausea or vomiting Anesthetic complications: no   There were no known notable events for this encounter.  Last Vitals:    Last Pain:                 Garnette FORBES [place]

## 2023-12-31 ENCOUNTER — Telehealth: Payer: Self-pay

## 2023-12-31 DIAGNOSIS — Z8673 Personal history of transient ischemic attack (TIA), and cerebral infarction without residual deficits: Secondary | ICD-10-CM | POA: Diagnosis not present

## 2023-12-31 DIAGNOSIS — I5020 Unspecified systolic (congestive) heart failure: Secondary | ICD-10-CM | POA: Diagnosis not present

## 2023-12-31 DIAGNOSIS — Q279 Congenital malformation of peripheral vascular system, unspecified: Secondary | ICD-10-CM | POA: Diagnosis not present

## 2023-12-31 DIAGNOSIS — Z7982 Long term (current) use of aspirin: Secondary | ICD-10-CM | POA: Diagnosis not present

## 2023-12-31 DIAGNOSIS — I350 Nonrheumatic aortic (valve) stenosis: Secondary | ICD-10-CM | POA: Diagnosis not present

## 2023-12-31 DIAGNOSIS — I480 Paroxysmal atrial fibrillation: Secondary | ICD-10-CM | POA: Diagnosis not present

## 2023-12-31 DIAGNOSIS — R4189 Other symptoms and signs involving cognitive functions and awareness: Secondary | ICD-10-CM | POA: Diagnosis not present

## 2023-12-31 DIAGNOSIS — R911 Solitary pulmonary nodule: Secondary | ICD-10-CM | POA: Diagnosis not present

## 2023-12-31 DIAGNOSIS — D649 Anemia, unspecified: Secondary | ICD-10-CM | POA: Diagnosis not present

## 2023-12-31 DIAGNOSIS — I779 Disorder of arteries and arterioles, unspecified: Secondary | ICD-10-CM | POA: Diagnosis not present

## 2023-12-31 DIAGNOSIS — I872 Venous insufficiency (chronic) (peripheral): Secondary | ICD-10-CM | POA: Diagnosis not present

## 2023-12-31 NOTE — Telephone Encounter (Addendum)
 Patient contacted regarding discharge from [facility] on 12/30/23  Patient understands to follow up with provider Izetta Hummer, PA-C on 01/04/24 at 12:00PM at [address] Location. Patient understands discharge instructions? Yes Patient understands medications and regiment? Yes Patient understands to bring all medications to this visit? Yes

## 2024-01-01 ENCOUNTER — Encounter: Payer: Self-pay | Admitting: Psychology

## 2024-01-01 DIAGNOSIS — R911 Solitary pulmonary nodule: Secondary | ICD-10-CM | POA: Diagnosis not present

## 2024-01-01 DIAGNOSIS — D649 Anemia, unspecified: Secondary | ICD-10-CM | POA: Diagnosis not present

## 2024-01-01 DIAGNOSIS — I779 Disorder of arteries and arterioles, unspecified: Secondary | ICD-10-CM | POA: Diagnosis not present

## 2024-01-01 DIAGNOSIS — I480 Paroxysmal atrial fibrillation: Secondary | ICD-10-CM | POA: Diagnosis not present

## 2024-01-01 DIAGNOSIS — I5020 Unspecified systolic (congestive) heart failure: Secondary | ICD-10-CM | POA: Diagnosis not present

## 2024-01-01 DIAGNOSIS — Q279 Congenital malformation of peripheral vascular system, unspecified: Secondary | ICD-10-CM | POA: Diagnosis not present

## 2024-01-01 DIAGNOSIS — I350 Nonrheumatic aortic (valve) stenosis: Secondary | ICD-10-CM | POA: Diagnosis not present

## 2024-01-01 DIAGNOSIS — I872 Venous insufficiency (chronic) (peripheral): Secondary | ICD-10-CM | POA: Diagnosis not present

## 2024-01-01 DIAGNOSIS — Z8673 Personal history of transient ischemic attack (TIA), and cerebral infarction without residual deficits: Secondary | ICD-10-CM | POA: Diagnosis not present

## 2024-01-01 DIAGNOSIS — R4189 Other symptoms and signs involving cognitive functions and awareness: Secondary | ICD-10-CM | POA: Diagnosis not present

## 2024-01-04 ENCOUNTER — Ambulatory Visit: Attending: Physician Assistant | Admitting: Physician Assistant

## 2024-01-04 VITALS — BP 106/62 | HR 86 | Ht 65.0 in | Wt 173.4 lb

## 2024-01-04 DIAGNOSIS — I5020 Unspecified systolic (congestive) heart failure: Secondary | ICD-10-CM | POA: Diagnosis not present

## 2024-01-04 DIAGNOSIS — I10 Essential (primary) hypertension: Secondary | ICD-10-CM | POA: Diagnosis not present

## 2024-01-04 DIAGNOSIS — I480 Paroxysmal atrial fibrillation: Secondary | ICD-10-CM | POA: Diagnosis not present

## 2024-01-04 DIAGNOSIS — D649 Anemia, unspecified: Secondary | ICD-10-CM | POA: Diagnosis not present

## 2024-01-04 DIAGNOSIS — I447 Left bundle-branch block, unspecified: Secondary | ICD-10-CM

## 2024-01-04 DIAGNOSIS — Z952 Presence of prosthetic heart valve: Secondary | ICD-10-CM

## 2024-01-04 MED ORDER — AMOXICILLIN 500 MG PO TABS: 2000.0000 mg | ORAL_TABLET | ORAL | 12 refills | Status: AC

## 2024-01-04 NOTE — Patient Instructions (Signed)
 Medication Instructions:  Your physician has recommended you make the following change in your medication:  Start Amoxicillin  500 mg, take 4 tablets by mouth 1 hour prior to dental procedures and cleanings.    *If you need a refill on your cardiac medications before your next appointment, please call your pharmacy*  Lab Work: None needed today, you will get lab work with your primary care If you have labs (blood work) drawn today and your tests are completely normal, you will receive your results only by: MyChart Message (if you have MyChart) OR A paper copy in the mail If you have any lab test that is abnormal or we need to change your treatment, we will call you to review the results.  Testing/Procedures: 02/08/24 Your physician has requested that you have an echocardiogram. Echocardiography is a painless test that uses sound waves to create images of your heart. It provides your doctor with information about the size and shape of your heart and how well your heart's chambers and valves are working. This procedure takes approximately one hour. There are no restrictions for this procedure. Please do NOT wear cologne, perfume, aftershave, or lotions (deodorant is allowed). Please arrive 15 minutes prior to your appointment time.  Please note: We ask at that you not bring children with you during ultrasound (echo/ vascular) testing. Due to room size and safety concerns, children are not allowed in the ultrasound rooms during exams. Our front office staff cannot provide observation of children in our lobby area while testing is being conducted. An adult accompanying a patient to their appointment will only be allowed in the ultrasound room at the discretion of the ultrasound technician under special circumstances. We apologize for any inconvenience.   Follow-Up: At [facility], you and your health needs are our priority.  As part of our continuing mission to provide you with exceptional  heart care, our providers are all part of one team.  This team includes your primary Cardiologist (physician) and Advanced Practice Providers or APPs (Physician Assistants and Nurse Practitioners) who all work together to provide you with the care you need, when you need it.  Your next appointment:   As scheduled on 02/10/24  Provider:   Dr. Ozell Fell  We recommend signing up for the patient portal called MyChart.  Sign up information is provided on this After Visit Summary.  MyChart is used to connect with patients for Virtual Visits (Telemedicine).  Patients are able to view lab/test results, encounter notes, upcoming appointments, etc.  Non-urgent messages can be sent to your provider as well.   To learn more about what you can do with MyChart, go to [email or web address].

## 2024-01-04 NOTE — Progress Notes (Unsigned)
 HEART AND VASCULAR CENTER   MULTIDISCIPLINARY HEART VALVE CLINIC                                     Cardiology Office Note:    Date:  01/05/2024   ID:  Julia Bowman, DOB March 10, 1945, MRN [id number]  PCP:  Julia Bowman LABOR, MD  [facility] Cardiologist:  Julia Scarce, MD  [facility] Structural heart: Julia Fell, MD [facility] Electrophysiologist:  None   Referring MD: Julia Bowman LABOR, MD   [facility] s/p TAVR  History of Present Illness:    Julia Bowman is a 78 y.o. female with a hx of HLD, mild cognitive impairment, TIA, PAF, and HFrEF with recent admission and critical AS s/p TAVR (12/29/23) who presents to clinic for follow up.   Admitted 07/2023 with possible TIA. Seen by neurology who felt that imaging was inconsistent with symptoms. Discharged on aspirin . Echo showed EF 50-55% and severe AS. She was referred to cardiology. Cardiac monitor in 09/2023 showed no atrial fibrillation.    She was readmitted 11/4-11/6/25 for new onset acute CHF. Limited echo 11/5 showed new LV dysfunction with EF 30% and critical AS with mean grad 51 mmHg, Vmax 4.5 m/s, AVA 0.51, mod MR/TR/AI. She responded well to a single dose of IV furosemide . Cardiac catheterization 12/03/23 showed patent coronary arteries with no significant CAD. Mean PA pressure was mildly elevated at 31 mmHg, cardiac output preserved with an index of 2.8. She was discharged on furosemide  20 mg daily. There was also a question of possible PAF on admission but no recurrence on telemetry. OAC was deferred.  S/p TAVR with a 26 mm Edwards Sapien 3 Ultra Resilia THV via the TF approach on 12/29/23. Post operative echo showed EF 40-45%, mod pulm HTN, small pericardial effusion, mod MAC with mild MR, normally functioning TAVR with a mean gradient of 10 mmHg and mild central AI. LVEDP 29 mm hg at the time of TAVR.  Treated with IV lasix  40mg  and home Lasix  increased from 20mg  to 40mg  daily.   Today the patient presents to clinic  for follow up. Here with [name]. No CP or SOB. Chronic mild LE edema that is stable but no orthopnea or PND. No dizziness or syncope. No blood in stool or urine. No palpitations. She feels fabulous- the best she has in years.    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 Fell and Dr. Daniel   Severe aortic stenosis      Current Medications: Current Meds  Medication Sig   amoxicillin  (AMOXIL ) 500 MG tablet Take 4 tablets (2,000 mg total) by mouth as directed. 1 hour prior to dental work including cleanings   aspirin  EC 81 MG tablet Take 1 tablet (81 mg total) by mouth daily. Swallow whole.   atorvastatin  (LIPITOR) 20 MG tablet Take 1 tablet (20 mg total) by mouth daily.   cyanocobalamin  (,VITAMIN B-12,) 1000 MCG/ML injection Inject 1,000 mcg into the skin every 30 (thirty) days.    furosemide  (LASIX ) 40 MG tablet Take 1 tablet (40 mg total) by mouth daily.  losartan  (COZAAR ) 25 MG tablet Take 1 tablet (25 mg total) by mouth daily.   Multiple Vitamin (MULTIVITAMIN WITH MINERALS) TABS tablet Take 1 tablet by mouth daily.      ROS:   Please see the history of present illness.    All other systems reviewed and are negative.  EKGs   EKG Interpretation Date/Time:  Monday January 04 2024 11:52:31 EST Ventricular Rate:  86 PR Interval:  216 QRS Duration:  150 QT Interval:  426 QTC Calculation: 509 R Axis:   -18  Text Interpretation: Sinus rhythm with 1st degree A-V block Left bundle branch block When compared with ECG of 30-Dec-2023 04:02, PR interval has increased QT has shortened Confirmed by Julia Bowman [phone]) on 01/04/2024 12:14:34 PM   Risk Assessment/Calculations:           Physical  Exam:    VS:  BP 106/62   Pulse 86   Ht 5' 5 (1.651 m)   Wt 173 lb 6.4 oz (78.7 kg)   SpO2 95%   BMI 28.86 kg/m     Wt Readings from Last 3 Encounters:  01/04/24 173 lb 6.4 oz (78.7 kg)  12/29/23 171 lb (77.6 kg)  12/17/23 175 lb (79.4 kg)     GEN: Well nourished, well developed in no acute distress NECK: No JVD CARDIAC: RRR, no murmurs, rubs, gallops RESPIRATORY:  Clear to auscultation without rales, wheezing or rhonchi  ABDOMEN: Soft, non-tender, non-distended EXTREMITIES:  Trace bilateral LE edema; No deformity.  Groin sites clear without hematoma or ecchymosis.   ASSESSMENT:    1. S/P TAVR (transcatheter aortic valve replacement)   2. HFrEF (heart failure with reduced ejection fraction) (HCC)   3. LBBB (left bundle branch block)   4. PAF (paroxysmal atrial fibrillation) (HCC)   5. Primary hypertension   6. Anemia, unspecified type     PLAN:    In order of problems listed above:  Severe AS s/p TAVR:  -- Pt doing excellent s/p TAVR with a marked improvement in symptoms.  -- Groin sites healing well.  -- SBE prophylaxis discussed; I have RX'd amoxicillin .  -- Continue Aspirin  81mg  daily. -- Cleared to resume all activities without restriction. -- Seeing back by Dr. Wonda for 1 month office visit after echo.   Chronic HFrEF: -- EF improved from 30% to 40-45% s/p TAVR. I am hopeful EF will totally normalize by 1 month assessment.  -- Appears euvolemic.  -- Continue losartan  25mg  daily and Lasix  40mg  daily.  -- Wanted to do a BMET today but lab was closed for lunch and she is seeing PCP on Thursday. Will have him get BMET and fax to our office.    New LBBB: -- ECG today with 1st deg AV block/LBBB.  -- Wearing a Zio AT with no high risk alerts.   Possible PAF: -- I reviewed ECGs and tracing 12/01/23 showed afib vs sinus with baseline artifact/PVCs. -- Has evidence of infarcts on MRI although it didn't correlate with presentation at time of possible TIA in  07/2023.  -- Reviewed ECG with Dr. Inocencio with EP and repeat monitor was recommended to monitor for PAF. -- Wearing a Zio AT.    HTN: -- BP well controlled.  -- Continue losartan  25mg  daily and Lasix  40mg  daily.    Post op anemia: -- Hg dropped from 12.1--> 9.6 post operatively.  -- CBC at the time of BMET with PCP.    Pulmonary nodule: -- Pre TAVR CTA noted a 4 mm  right solid pulmonary nodule within the upper lobe. No follow-up needed if patient is low-risk. Cardiac CT over-read by a different radiologist noted that the 6 mm right lower lobe nodule dating back to 11/22/2019, likely benign. -- This was discussed with the pt. She has no personal cancer history or smoking history and no follow up is required.      Cardiac Rehabilitation Eligibility Assessment  The patient is ready to start cardiac rehabilitation from a cardiac standpoint.    Medication Adjustments/Labs and Tests Ordered: Current medicines are reviewed at length with the patient today.  Concerns regarding medicines are outlined above.  Orders Placed This Encounter  Procedures   EKG 12-Lead   ECHOCARDIOGRAM COMPLETE   Meds ordered this encounter  Medications   amoxicillin  (AMOXIL ) 500 MG tablet    Sig: Take 4 tablets (2,000 mg total) by mouth as directed. 1 hour prior to dental work including cleanings    Dispense:  12 tablet    Refill:  12    Supervising Provider:   WONDA SHARPER [[id number]]    Patient Instructions  Medication Instructions:  Your physician has recommended you make the following change in your medication:  Start Amoxicillin  500 mg, take 4 tablets by mouth 1 hour prior to dental procedures and cleanings.    *If you need a refill on your cardiac medications before your next appointment, please call your pharmacy*  Lab Work: None needed today, you will get lab work with your primary care If you have labs (blood work) drawn today and your tests are completely normal, you will receive your results  only by: MyChart Message (if you have MyChart) OR A paper copy in the mail If you have any lab test that is abnormal or we need to change your treatment, we will call you to review the results.  Testing/Procedures: 02/08/24 Your physician has requested that you have an echocardiogram. Echocardiography is a painless test that uses sound waves to create images of your heart. It provides your doctor with information about the size and shape of your heart and how well your heart's chambers and valves are working. This procedure takes approximately one hour. There are no restrictions for this procedure. Please do NOT wear cologne, perfume, aftershave, or lotions (deodorant is allowed). Please arrive 15 minutes prior to your appointment time.  Please note: We ask at that you not bring children with you during ultrasound (echo/ vascular) testing. Due to room size and safety concerns, children are not allowed in the ultrasound rooms during exams. Our front office staff cannot provide observation of children in our lobby area while testing is being conducted. An adult accompanying a patient to their appointment will only be allowed in the ultrasound room at the discretion of the ultrasound technician under special circumstances. We apologize for any inconvenience.   Follow-Up: At [facility], you and your health needs are our priority.  As part of our continuing mission to provide you with exceptional heart care, our providers are all part of one team.  This team includes your primary Cardiologist (physician) and Advanced Practice Providers or APPs (Physician Assistants and Nurse Practitioners) who all work together to provide you with the care you need, when you need it.  Your next appointment:   As scheduled on 02/10/24  Provider:   Dr. Sharper Julia Bowman  We recommend signing up for the patient portal called MyChart.  Sign up information is provided on this After Visit Summary.  MyChart is used  to  connect with patients for Virtual Visits (Telemedicine).  Patients are able to view lab/test results, encounter notes, upcoming appointments, etc.  Non-urgent messages can be sent to your provider as well.   To learn more about what you can do with MyChart, go to [email or web address].          Signed, Lamarr Hummer, PA-C  01/05/2024 10:26 AM    [facility]

## 2024-01-05 DIAGNOSIS — I350 Nonrheumatic aortic (valve) stenosis: Secondary | ICD-10-CM | POA: Diagnosis not present

## 2024-01-05 DIAGNOSIS — Z130 Encounter for screening for diseases of the blood and blood-forming organs and certain disorders involving the immune mechanism: Secondary | ICD-10-CM | POA: Diagnosis not present

## 2024-01-05 DIAGNOSIS — I5020 Unspecified systolic (congestive) heart failure: Secondary | ICD-10-CM | POA: Diagnosis not present

## 2024-01-05 DIAGNOSIS — Z7982 Long term (current) use of aspirin: Secondary | ICD-10-CM | POA: Diagnosis not present

## 2024-01-05 DIAGNOSIS — R4189 Other symptoms and signs involving cognitive functions and awareness: Secondary | ICD-10-CM | POA: Diagnosis not present

## 2024-01-05 DIAGNOSIS — D649 Anemia, unspecified: Secondary | ICD-10-CM | POA: Diagnosis not present

## 2024-01-05 DIAGNOSIS — Z952 Presence of prosthetic heart valve: Secondary | ICD-10-CM | POA: Diagnosis not present

## 2024-01-05 DIAGNOSIS — Z8673 Personal history of transient ischemic attack (TIA), and cerebral infarction without residual deficits: Secondary | ICD-10-CM | POA: Diagnosis not present

## 2024-01-05 DIAGNOSIS — E538 Deficiency of other specified B group vitamins: Secondary | ICD-10-CM | POA: Diagnosis not present

## 2024-01-05 DIAGNOSIS — Z0000 Encounter for general adult medical examination without abnormal findings: Secondary | ICD-10-CM | POA: Diagnosis not present

## 2024-01-05 LAB — LAB REPORT - SCANNED: EGFR: 84

## 2024-01-06 ENCOUNTER — Ambulatory Visit: Payer: Self-pay | Admitting: Physician Assistant

## 2024-01-06 DIAGNOSIS — R41841 Cognitive communication deficit: Secondary | ICD-10-CM | POA: Diagnosis not present

## 2024-01-06 DIAGNOSIS — R4189 Other symptoms and signs involving cognitive functions and awareness: Secondary | ICD-10-CM | POA: Diagnosis not present

## 2024-01-07 ENCOUNTER — Encounter (HOSPITAL_BASED_OUTPATIENT_CLINIC_OR_DEPARTMENT_OTHER): Payer: Self-pay | Admitting: Cardiovascular Disease

## 2024-01-12 DIAGNOSIS — R41841 Cognitive communication deficit: Secondary | ICD-10-CM | POA: Diagnosis not present

## 2024-01-12 DIAGNOSIS — R4189 Other symptoms and signs involving cognitive functions and awareness: Secondary | ICD-10-CM | POA: Diagnosis not present

## 2024-01-25 DIAGNOSIS — I447 Left bundle-branch block, unspecified: Secondary | ICD-10-CM

## 2024-01-25 DIAGNOSIS — I480 Paroxysmal atrial fibrillation: Secondary | ICD-10-CM | POA: Diagnosis not present

## 2024-01-25 DIAGNOSIS — Z952 Presence of prosthetic heart valve: Secondary | ICD-10-CM | POA: Diagnosis not present

## 2024-01-25 NOTE — Addendum Note (Signed)
 Encounter addended by: Malvina Pina A on: 01/25/2024 8:17 AM  Actions taken: Imaging Exam ended

## 2024-01-26 ENCOUNTER — Ambulatory Visit: Payer: Self-pay | Admitting: Physician Assistant

## 2024-01-26 ENCOUNTER — Encounter: Attending: Psychology | Admitting: Psychology

## 2024-01-26 ENCOUNTER — Encounter: Admitting: Psychology

## 2024-01-26 DIAGNOSIS — R4189 Other symptoms and signs involving cognitive functions and awareness: Secondary | ICD-10-CM

## 2024-01-26 DIAGNOSIS — Z8673 Personal history of transient ischemic attack (TIA), and cerebral infarction without residual deficits: Secondary | ICD-10-CM

## 2024-01-26 NOTE — Progress Notes (Signed)
 "  NEUROPSYCHOLOGICAL EVALUATION [place]  Physical Medicine and Rehabilitation     Patient: Julia Bowman  DOB: April 29, 1945  Age: 78 y.o. Sex: female  Race/Ethnicity: White or Caucasian  Years of Ed.: 12  Collateral Source: Actor (friend)  Referring Provider ([facility]): Feliciano Devoria LABOR, MD Neurologist ([facility]): Tonuzi, Lirim, MD  Provider / Neuropsychologist: Evalene DOROTHA Riff, PsyD Date of Service: 01/26/24 Start: 3 PM End: 5 PM Location of Service:  [name] Physical Medicine & Rehabilitation Department [address], [place], [place] [address] Billing Code/Service/Individuals Present: [phone] (1 Unit), [phone] (1 Unit)Patient was seen accompanied by her friend who provided collateral with her permission, in-person, by the provider. 1 hour and 15 minutes spent in face-to-face clinical interview and remaining 45 minutes was spent in record review, documentation, and testing protocol construction.    PATIENT CONSENT AND CONFIDENTIALITY The patient's understanding of the reason for referral was intact. Discussed limits of confidentiality including, but not limited to, posting of final evaluation report in the patient's electronic medical record for both the patient and for the referring provider and appropriate medical professionals. Patient was given the opportunity to have their questions answered. The neuropsychological evaluation process was discussed with the patient and they consented to proceed with the evaluation.  Consent for Evaluation and Treatment: Signed: Yes Explanation of Privacy Policies: Signed: Yes Discussion of Confidentiality Limits: Yes  REASON FOR REFERRAL & RECORD REVIEW Per records, the patient was referred for neuropsychological evaluation by Dr. Feliciano with [facility] due to concerns for cognitive decline. The patient has history of suspected TIA in July of 2025. She presented to the  ED with ataxia, disequilibrium, and unsteady gait.  Symptoms resolved after 2 hours.  Per neurology notes (11/30/23), radiographic evidence was found for previous right sided stroke. 08/13/23 MRI imaging of the brain without contrast reported 1. Punctate acute infarct in the left postcentral gyrus versus artifact. 2. Remote right frontal and right occipital infarcts and moderate to severe chronic microvascular ischemic change.  Initial CT head imaging (08/12/23) reported mild cerebral atrophy within normal limits for age. Neurology records indicated work-up suggested mild cognitive impairment, and recommendations emphasized management of stroke. MoCA score was 25/30. Additional memory testing was recommended, and the provider encouraged patient engagement in cognitive and physical activities. Donepezil was proposed as a possible option in the future. Management of cardiovascular risk factors was recommended. ATN profile was reportedly normal, negative for AD bio-markers. The patient underwent TAVR in early December 2025 due to severe symptomatic aortic stenosis. She discharged home in good condition. n.   Upon interview, the patient expressed understanding regarding reasons for referral and purpose of the evaluation. She was accompanied by a close friend, who provided collateral information with patient permission. The patient and her friend indicated they were hoping to better understand how the patient was doing with respect to cognitive functioning, and to know what to expect for the future.   HISTORY OF PRESENTING CONCERNS:  Cognitive Symptom Onset & Course: The patient indicated that she recognized she has had some cognitive changes, but admitted she may be reluctant to acknowledge them. Collateral reported that she, along with the patient's small group of friends, have observed indications of cognitive changes in the patient. Subtle changes were first noticed possibly six to eight months prior to the 2025  TIA. The acute symptoms resolved, but some persistent cognitive changes remain. Work with rehab (SLP/OT) has reportedly been helpful  with integration of compensatory strategies.   Current Cognitive Complaints:  Memory:  Patient: Reported that she may have some declines in short term memory. However, she denied noticing problems remembering conversations or recent events. She indicated that she has found work with OT helpful in utilizing a calendar to keep track of appointments and obligations. She utilizes a pill dance movement psychotherapist. She has not driven since the TIA. There were some instances in which she got lost in familiar places when she was driving, however.  Collateral: Reported observing difficulties in recalling conversations, recent events, and keeping track of appointments/obligations.  Processing Speed:  Patient: Patient denied noticing any significant changes in her processing speed. Collateral: Denied noticing any major indications of slowed processing. Attention & Concentration: Patient: Patient offered that she may have had some difficulty with attention and concentration and she is gotten older, but she denied noticing any significant changes in 2025.  Collateral: Denied observing profound difficulties with attention and concentration although there may be some mild changes.  Language:  Patient: Denied noticing any changes in word finding or problems with understanding of others.  Collateral: Denied noticing any changes in the patient's expressive or receptive language. Visual-Spatial:  Patient: Still quilting. No issues. Collateral: No clear changes. .    Executive Functioning:  Patient: Endorsed historical difficulties with organization, but denied any worsening. Reported intact basic problem solving, but acknowledged that she may struggle more if it was a big problem than she would have in the past.  Collateral: Denied observing clear changes in personality, impulsivity, behavior, or  marked executive dysfunction.  Motor/Sensory Complaints:  Sensory changes: No smell or taste changes. No changes in hearing. Wears hearing aids. Vision is adequate. Utilizes reading glasses when reading.  Balance/coordination difficulties: No significant changes or problems involving balance. No frequent falls.  Frequent instances of dizziness/vertigo: No recurrent vertigo or lightheadedness.  Other motor difficulties: No tremors. No other motor difficulties.   Emotional and Behavioral Functioning:  The patient denied any history of mental health difficulties. No prior diagnosis or treatment. At present, she denies indications of depression (chronic/frequent low mood/sadness, anhedonia, SI), or anxiety (excessive/difficult-to-control worry, difficulty relaxing, frequent feelings of tension). She has no history of trauma. No history of panic attack. Denied any current or past suicidal ideation, homicidal ideation, hallucination, delusions/paranoia, inpatient psychiatric admission, or indications of mania/hypomania. Collateral reported a brief change in her affect around time of the stroke, but nothing prior or since.  Sleep: Sleeps around 5-6.5 hours per night. May feel better if obtaining 7 hours. Predominantly feels rested in the morning. No difficulties with onset or maintenance. No OSA, no frequent nightmares, no indirect indications of RBDs. Appetite: Good/unchanged. Caffeine: 1-2 16 oz soda (but with lots of ice).   Alcohol Use: No alcohol. No hx problem Tobacco Use: None. Recreational Substance Use: None.   Level of Functional Independence: The patient is intact with basic activities of daily living.  Finances: Independent. Denies problems. Doing those myself. Nothing that I'm aware of. A few bills that need to remember to pay.  Shopping / Meal Preparation: No issues. Meals prepared by facility.  Household Maintenance / Chores: No significant changes.  Tracking Appointments / Future  Obligations: Some difficulties with keeping track of appointments and remembering details from medical visits. She has begun learning to use a calendar with support of OT. She receives support form her friends, who help her get to medical appointments and join her for the visits.  Medication Management: Utilizes pill organizer. Independent. Pill  box use started following stroke. Previously had few medications. The patient's friend indicated some concern about the way in which the patient utilizes the pill box, but it is not clear that medications are being missed. Monitoring may be beneficial.    Driving: Reportedly no longer drives.     Medical History/Record Review: Per records and patient report, History of traumatic brain injury/concussion: No   History of stroke: See record review History of heart attack: No History of cancer/chemotherapy: No   History of seizure activity: No   Symptoms of chronic pain: No   Experience of frequent headaches/migraines: Seldom.    Imaging/Lab Results:  Per records, 08-12-23: MRI HEAD WITHOUT CONTRAST ... COMPARISON: CT head 08/12/2023.  FINDINGS: Brain: Punctate focus of DWI hyperintensity in the left postcentral gyrus (series 14, image 91) which is difficult to confirm on ADC. No acute hemorrhage, hydrocephalus, extra-axial collection or mass lesion. Moderate to severe patchy and confluent T2/FLAIR hyperintensities in the white matter, compatible with chronic microvascular ischemic disease. Remote right frontal and right occipital infarcts. Small remote left cerebellar infarct. ... IMPRESSION: 1. Punctate acute infarct in the left postcentral gyrus versus artifact. 2. Remote right frontal and right occipital infarcts and moderate to severe chronic microvascular ischemic change.  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 Wonda and Dr. 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   Medications: Per records, amoxicillin  (AMOXIL ) 500 MG tablet aspirin  EC 81 MG tablet atorvastatin  (LIPITOR) 20 MG tablet cyanocobalamin  (,VITAMIN B-12,) 1000 MCG/ML injection fluticasone  (FLONASE ) 50 MCG/ACT nasal spray furosemide  (LASIX ) 40 MG tablet hydroxypropyl methylcellulose / hypromellose (ISOPTO TEARS / GONIOVISC) 2.5 % ophthalmic solution losartan  (COZAAR ) 25 MG tablet Multiple Vitamin (MULTIVITAMIN WITH MINERALS) TABS tablet triamcinolone  (NASACORT ) 55 MCG/ACT AERO nasal inhaler    Academic/Vocational History: No history of learning difficulties, attention/behavioral issues, or problems with grade retention. Tended to earn low As to high Bs. Graduated high school. Did one semester of college, but withdrew to support her husbands education. Retired at age of 27. 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  Psychosocial: Marital Status: Widowed. Married 57 years. Spouse passed about 2.5 years ago.    Living Situation: 5.5 years current senior living community. She is happy with the environment.  Daily Activities/Hobbies: Watch TV, reads  a lot, and quilts. Seated volley ball. Socializing.   Mental Status/Behavioral Observations: The patient was seen on an outpatient basis in the [facility] PM&R office for the clinical interview accompanied by her close friend, who provided collateral with the patient's permission.  Sensorium/Arousal: Hearing  and vision intact for the demands of testing. Wears hearing aids. She was alert.    Orientation: Intact.    Appearance: Appropriate dress and hygiene.    Behavior: Engaged, cooperative, friendly.    Speech/Language: Conversational speech was prosodic, fluent, and well-articulated. Auditory-verbal comprehension appeared grossly intact within conversation.    Motor: Ambulated independently and without issue.    Social Comportment: Appropriate for the setting.   Mood: Euthymic.   Affect: Congruent.    Thought Process/Content: Coherent, linear, goal directed.    Ability to Participate in Interview: The patient readily answered all questions posed during the clinical interview. There appeared to be some possible difficulties with recall and collateral report was helpful.    Insight: Fair.     SUMMARY / CLINICAL IMPRESSIONS Per records, the patient was referred for neuropsychological evaluation by Dr. Feliciano with [facility] due to concerns for cognitive decline. The patient has history of suspected TIA in July of 2025. She presented to the ED with ataxia, disequilibrium, and unsteady gait.  Symptoms resolved after 2 hours.  Per neurology notes (11/30/23), radiographic evidence was found for previous right sided stroke. 08/13/23 MRI imaging of the brain without contrast reported 1. Punctate acute infarct in the left postcentral gyrus versus artifact. 2. Remote right frontal and right occipital infarcts and moderate to severe chronic microvascular ischemic change.  Initial CT head imaging (08/12/23) reported mild cerebral atrophy within normal limits for age. Neurology records indicated work-up  suggested mild cognitive impairment, and recommendations emphasized management of stroke. MoCA score was 25/30. Additional memory testing was recommended, and the provider encouraged patient engagement in cognitive and physical activities. Donepezil was proposed as a possible option in the future. Management of cardiovascular risk factors was recommended. ATN profile was reportedly normal, negative for AD bio-markers. The patient underwent TAVR in early December 2025 due to severe symptomatic aortic stenosis. She discharged home in good condition.   Upon interview, the patient expressed understanding regarding reasons for referral and purpose of the evaluation. She was accompanied by a close friend, who provided collateral information with patient permission. The patient and her friend indicated they were hoping to better understand how the patient was doing with respect to cognitive functioning, and to know what to expect for the future. The patient appeared to have recognition of changes in her cognition, although concerns were more modest relative to those expressed by collateral. Difficulties primarily involve short term memory. Cognitive changes were reported to be noticeable 6-8 months prior to July TIA. Following resolution of acute/transient symptoms, cognitive functioning appears to be largely stable. No significant psychiatric or behavioral issues were noted. The patient receives limited support in instrumental activities of daily living, although the need for support does reflect a change. Formal neuropsychological evaluation is indicated given the above history and concerns for neurocognitive changes.   DISPOSITION / PLAN The patient has been set up for a formal neuropsychological assessment to objectively assess her cognitive functioning across domains to establish the patient's cognitive profile. This data, in conjunction with information obtained via clinical interview and medical record review,  will help clarify likely etiology and guide treatment recommendations. Once data collection and interpretation have been completed, the findings / diagnosis and recommendations will be reviewed and discussed with the patient during a feedback appointment with the neuropsychologist. Based on the collaborative dialogue with the patient during the feedback, recommendations may be adjusted / tailored as needed. A formal report will be produced and provided to the patient and the  referring provider.   Diagnosis: Cognitive changes History of TIA  Full/final report to follow completion of the evaluation process (testing, interpretation, feedback). Diagnosis and recommendations to be adjusted accordingly.    Evalene DOROTHA Riff, PsyD [facility] PM&R-Clinical Neuropsychology [address] [place], [place] [address] Main: [phone] Fax: [phone] [place] License # [id number]  This report was generated using voice recognition software. While this document has been carefully reviewed, transcription errors may be present. I apologize in advance for any inconvenience. Please contact me if further clarification is needed.  "

## 2024-02-04 ENCOUNTER — Other Ambulatory Visit (HOSPITAL_COMMUNITY)

## 2024-02-08 ENCOUNTER — Ambulatory Visit (HOSPITAL_COMMUNITY)
Admission: RE | Admit: 2024-02-08 | Discharge: 2024-02-08 | Disposition: A | Discharge status: Home / Self Care | Source: Ambulatory Visit | Attending: Internal Medicine | Admitting: Internal Medicine

## 2024-02-08 DIAGNOSIS — Z952 Presence of prosthetic heart valve: Secondary | ICD-10-CM | POA: Insufficient documentation

## 2024-02-08 LAB — ECHOCARDIOGRAM COMPLETE
AR max vel: 1.66 cm2
AV Area VTI: 1.62 cm2
AV Area mean vel: 1.58 cm2
AV Mean grad: 10.7 mmHg
AV Peak grad: 20 mmHg
Ao pk vel: 2.23 m/s
Area-P 1/2: 3.86 cm2
S' Lateral: 2.57 cm

## 2024-02-09 ENCOUNTER — Ambulatory Visit: Payer: Self-pay | Admitting: Physician Assistant

## 2024-02-10 ENCOUNTER — Ambulatory Visit: Attending: Cardiology | Admitting: Cardiovascular Disease

## 2024-02-10 ENCOUNTER — Encounter: Payer: Self-pay | Admitting: Cardiovascular Disease

## 2024-02-10 VITALS — BP 120/64 | HR 84 | Ht 65.0 in | Wt 171.0 lb

## 2024-02-10 DIAGNOSIS — I5020 Unspecified systolic (congestive) heart failure: Secondary | ICD-10-CM

## 2024-02-10 DIAGNOSIS — Z952 Presence of prosthetic heart valve: Secondary | ICD-10-CM

## 2024-02-10 DIAGNOSIS — I10 Essential (primary) hypertension: Secondary | ICD-10-CM | POA: Diagnosis not present

## 2024-02-10 NOTE — Patient Instructions (Addendum)
 Medication Instructions:  No medication changes were made at this visit. Continue current regimen.   *If you need a refill on your cardiac medications before your next appointment, please call your pharmacy*  Lab Work: None ordered today. If you have labs (blood work) drawn today and your tests are completely normal, you will receive your results only by: MyChart Message (if you have MyChart) OR A paper copy in the mail If you have any lab test that is abnormal or we need to change your treatment, we will call you to review the results.  Testing/Procedures: Your physician has requested that you have an echocardiogram to be completed in 1 year (prior to structural heart follow-up with Izetta Hummer, PA-C). Echocardiography is a painless test that uses sound waves to create images of your heart. It provides your doctor with information about the size and shape of your heart and how well your hearts chambers and valves are working. This procedure takes approximately one hour. There are no restrictions for this procedure. Please do NOT wear cologne, perfume, aftershave, or lotions (deodorant is allowed). Please arrive 15 minutes prior to your appointment time.  Please note: We ask at that you not bring children with you during ultrasound (echo/ vascular) testing. Due to room size and safety concerns, children are not allowed in the ultrasound rooms during exams. Our front office staff cannot provide observation of children in our lobby area while testing is being conducted. An adult accompanying a patient to their appointment will only be allowed in the ultrasound room at the discretion of the ultrasound technician under special circumstances. We apologize for any inconvenience.   Follow-Up: At [facility], you and your health needs are our priority.  As part of our continuing mission to provide you with exceptional heart care, our providers are all part of one team.  This team includes  your primary Cardiologist (physician) and Advanced Practice Providers or APPs (Physician Assistants and Nurse Practitioners) who all work together to provide you with the care you need, when you need it.  Your next appointment:   3 month(s) with Dr. Raford  1 year with Izetta Hummer, PA-C

## 2024-02-10 NOTE — Assessment & Plan Note (Addendum)
 30 day echo shows improved LV function and normal TAVR valve function with LVEF 50-55%, normal RV function, and mean transaortic gradient 11 mmHg with no PVL. Pt reports NYHA functional class 1 limitation.  Needs to continue to follow SBE prophylaxis when indicated.  Will arrange a 48-month follow-up with Dr. Raford and we will plan to see her back in valve clinic for her 1 year echocardiogram and office visit per protocol.

## 2024-02-10 NOTE — Progress Notes (Signed)
 " Cardiology Office Note:    Date:  02/10/2024   ID:  Julia Bowman, Julia Bowman [birth year], MRN [id number]  PCP:  Feliciano Devoria LABOR, MD   [facility] Providers Cardiologist:  Annabella Scarce, MD Structural Heart:  Ozell Fell, MD    Referring MD: Feliciano Devoria LABOR, MD   Chief Complaint  Patient presents with   Aortic Stenosis    History of Present Illness:    Julia Bowman is a 79 y.o. female presenting for her 30 day TAVR follow-up visit.  The patient has a history of mild cognitive impairment, TIA, critical aortic stenosis, and acute on chronic HFrEF in November 2025.  She presented with new LV dysfunction, LVEF 30% and critical aortic stenosis with mean gradient 51 mmHg.  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  She had improvement in her LVEF after an uncomplicated procedure with increase up to 40 to 45%.  She just recently underwent a 30-day follow-up echo that showed further improvement in LVEF, now 50 to 55%.  The patient is here with her friend today.  She feels very well.  She specifically denies chest pain, shortness of breath, orthopnea, PND, or leg edema.  She has no heart palpitations.  She specifically denies any cardiac-related complaints.   Current Medications: Active Medications[1]   Allergies:   Patient has no known allergies.   ROS:   Please see the history of present illness.    All other systems reviewed and are negative.  EKGs/Labs/Other Studies Reviewed:    The following studies were reviewed today: Cardiac Studies & Procedures   ______________________________________________________________________________________________ CARDIAC CATHETERIZATION  CARDIAC CATHETERIZATION 12/03/2023  Conclusion Conclusions: Mild focal plaquing of proximal LAD (10-20% stenosis).  Otherwise, no angiographically significant coronary artery disease. Moderately elevated  left heart filling pressures (PCWP 25, LVEDP 30-35 mmHg). Moderate pulmonary hypertension (PA 45/24, mean 31 mmHg). Mildly elevated right heart filling pressure (mean RA 6, RV 45/6 mmHg). Normal Fick cardiac output/index (CO 5.2 L/min, CI 2.8 L/min/m^2). Moderate to severe aortic valve stenosis (mean gradient 34 mmHg, AVA 0.8 cm^2).  Recommendations: Continue gentle diuresis and optimization of acute HFrEF and new atrial fibrillation. Further workup/management of aortic stenosis per structural heart team.  Lonni Hanson, MD [facility]  Findings Coronary Findings Diagnostic  Dominance: Right  Left Main Vessel is large. Vessel is angiographically normal.  Left Anterior Descending Vessel is large. Prox LAD lesion is 15% stenosed. The lesion is focal and eccentric.  First Diagonal Branch Vessel is small in size.  Third Diagonal Branch Vessel is moderate in size.  Ramus Intermedius Vessel is small. Vessel is angiographically normal.  Left Circumflex Vessel is large. Vessel is angiographically normal.  First Obtuse Marginal Branch Vessel is moderate in size.  Second Obtuse Marginal Branch Vessel is small in size.  Third Obtuse Marginal Branch Vessel is large in size.  Fourth Obtuse Marginal Branch Vessel is small in size.  Right Coronary Artery Vessel is large. Vessel is angiographically normal.  Right Posterior Descending Artery Vessel is large in size.  Right Posterior Atrioventricular Artery Vessel is small in size.  First Right Posterolateral Branch Vessel is small in size.  Second Right Posterolateral Branch Vessel is small in size.  Intervention  No interventions have been documented.     ECHOCARDIOGRAM  ECHOCARDIOGRAM COMPLETE 02/08/2024  Narrative ECHOCARDIOGRAM REPORT    Patient Name:   Julia Bowman Date of Exam: 02/08/2024  Medical Rec #:  [id number]       Height:       65.0 in Accession #:    [id number]      Weight:       173.4  lb Date of Birth:  [birth year]      BSA:          1.862 m Patient Age:    78 years        BP:           106/62 mmHg Patient Gender: F               HR:           99 bpm. Exam Location:  [address]  Procedure: 2D Echo, 3D Echo, Cardiac Doppler and Color Doppler (Both Spectral and Color Flow Doppler were utilized during procedure).  Indications:    I35.0 Aortic Stenosis  History:        Patient has prior history of Echocardiogram examinations, most recent 12/30/2023. CHF, TIA, Arrythmias:Atrial Fibrillation and LBBB, Signs/Symptoms:Edema; Risk Factors:Hypertension and Dyslipidemia. Aortic Stenosis status post TAVR (12-29-23- 26mm Edwards S3UR), Prior EF 40-45% (HFrEF).  Sonographer:    Heather Hawks RDCS Referring Phys: DEVORIA DELENA MAFFUCCI  IMPRESSIONS   1. Left ventricular ejection fraction, by estimation, is 50 to 55%. Left ventricular ejection fraction by 3D volume is 55 %. The left ventricle has low normal function. The left ventricle demonstrates regional wall motion abnormalities (see scoring diagram/findings for description). Left ventricular diastolic parameters are consistent with Grade I diastolic dysfunction (impaired relaxation). 2. Right ventricular systolic function is normal. The right ventricular size is normal. Tricuspid regurgitation signal is inadequate for assessing PA pressure. The estimated right ventricular systolic pressure is 29.0 mmHg. 3. Left atrial size was moderately dilated. 4. Right atrial size was mildly dilated. 5. The mitral valve is degenerative. Trivial mitral valve regurgitation. Moderate to severe mitral annular calcification. 6. The aortic valve has been repaired/replaced. Aortic valve regurgitation is not visualized. Echo findings are consistent with normal structure and function of the aortic valve prosthesis. Aortic valve area, by VTI measures 1.62 cm. Aortic valve mean gradient measures 10.7 mmHg. Aortic valve Vmax measures 2.23 m/s. Peak  gradient 20 mmHg, DI 0.36. 7. Aortic dilatation noted. There is borderline dilatation of the ascending aorta, measuring 37 mm. 8. The inferior vena cava is normal in size with <50% respiratory variability, suggesting right atrial pressure of 8 mmHg.  Comparison(s): Changes from prior study are noted. 12/30/2023: LVEF 40-45%.  FINDINGS Left Ventricle: Left ventricular ejection fraction, by estimation, is 50 to 55%. Left ventricular ejection fraction by 3D volume is 55 %. The left ventricle has low normal function. The left ventricle demonstrates regional wall motion abnormalities. The left ventricular internal cavity size was normal in size. There is no left ventricular hypertrophy. Left ventricular diastolic parameters are consistent with Grade I diastolic dysfunction (impaired relaxation). Indeterminate filling pressures.  Right Ventricle: The right ventricular size is normal. No increase in right ventricular wall thickness. Right ventricular systolic function is normal. Tricuspid regurgitation signal is inadequate for assessing PA pressure. The tricuspid regurgitant velocity is 2.29 m/s, and with an assumed right atrial pressure of 8 mmHg, the estimated right ventricular systolic pressure is 29.0 mmHg.  Left Atrium: Left atrial size was moderately dilated.  Right Atrium: Right atrial size was mildly dilated.  Pericardium: There is no evidence of pericardial effusion.  Mitral Valve: The mitral valve is degenerative in appearance. Moderate to severe mitral annular  calcification. Trivial mitral valve regurgitation.  Tricuspid Valve: The tricuspid valve is grossly normal. Tricuspid valve regurgitation is trivial.  Aortic Valve: The aortic valve has been repaired/replaced. Aortic valve regurgitation is not visualized. Aortic valve mean gradient measures 10.7 mmHg. Aortic valve peak gradient measures 20.0 mmHg. Aortic valve area, by VTI measures 1.62 cm. There is a 26 mm Sapien prosthetic,  stented (TAVR) valve present in the aortic position. Echo findings are consistent with normal structure and function of the aortic valve prosthesis.  Pulmonic Valve: The pulmonic valve was grossly normal. Pulmonic valve regurgitation is not visualized.  Aorta: Aortic dilatation noted. There is borderline dilatation of the ascending aorta, measuring 37 mm.  Venous: The inferior vena cava is normal in size with less than 50% respiratory variability, suggesting right atrial pressure of 8 mmHg.  IAS/Shunts: No atrial level shunt detected by color flow Doppler.  Additional Comments: 3D was performed not requiring image post processing on an independent workstation and was abnormal.   LEFT VENTRICLE PLAX 2D LVIDd:         4.30 cm         Diastology LVIDs:         2.57 cm         LV e' medial:    3.04 cm/s LV PW:         1.33 cm         LV E/e' medial:  23.8 LV IVS:        1.03 cm         LV e' lateral:   8.86 cm/s LVOT diam:     2.40 cm         LV E/e' lateral: 8.2 LV SV:         74 LV SV Index:   40 LVOT Area:     4.52 cm        3D Volume EF LV IVRT:       165 msec        LV 3D EF:    Left ventricul ar ejection fraction by 3D volume is 55 %.  3D Volume EF: 3D EF:        55 % LV EDV:       177 ml LV ESV:       79 ml LV SV:        98 ml  RIGHT VENTRICLE             IVC RV Basal diam:  3.80 cm     IVC diam: 1.60 cm RV S prime:     14.75 cm/s TAPSE (M-mode): 1.4 cm      PULMONARY VEINS RVSP:           24.0 mmHg   A Reversal Velocity: 34.60 cm/s Diastolic Velocity:  57.80 cm/s S/D Velocity:        0.90 Systolic Velocity:   54.50 cm/s  LEFT ATRIUM             Index        RIGHT ATRIUM           Index LA diam:        4.85 cm 2.61 cm/m   RA Pressure: 3.00 mmHg LA Vol (A2C):   80.9 ml 43.45 ml/m  RA Area:     19.80 cm LA Vol (A4C):   80.9 ml 43.45 ml/m  RA Volume:   54.40 ml  29.22 ml/m LA Biplane Vol: 80.9 ml 43.45 ml/m AORTIC VALVE  AV Area (Vmax):    1.66 cm AV Area  (Vmean):   1.58 cm AV Area (VTI):     1.62 cm AV Vmax:           223.33 cm/s AV Vmean:          152.000 cm/s AV VTI:            0.458 m AV Peak Grad:      20.0 mmHg AV Mean Grad:      10.7 mmHg LVOT Vmax:         82.00 cm/s LVOT Vmean:        53.200 cm/s LVOT VTI:          0.164 m LVOT/AV VTI ratio: 0.36  AORTA Ao Root diam: 3.60 cm Ao Asc diam:  3.70 cm  MITRAL VALVE                TRICUSPID VALVE MV Area (PHT): cm          TR Peak grad:   21.0 mmHg MV Decel Time: 197 msec     TR Vmax:        229.00 cm/s MV E velocity: 72.38 cm/s   Estimated RAP:  3.00 mmHg MV A velocity: 152.25 cm/s  RVSP:           24.0 mmHg MV E/A ratio:  0.48 SHUNTS Systemic VTI:  0.16 m Systemic Diam: 2.40 cm  Vinie Maxcy MD Electronically signed by Vinie Maxcy MD Signature Date/Time: 02/08/2024/4:43:28 PM    Final    MONITORS  LONG TERM MONITOR-LIVE TELEMETRY (3-14 DAYS) 01/25/2024  Narrative 14 Day Zio Monitor  Quality: Fair.  Baseline artifact. Predominant rhythm: sinus rhythm Average heart rate: 79 bpm Max heart rate: 134 bpm Min heart rate: 46 bpm Pauses >2.5 seconds: none  Rare (<1%) PACs. Occasional (1.7%) PVCs  6 episodes of NSVT lasting up to 15 beats.  Fastest episode 136 bpm. 43 episodes of SVT lasting up to 17.2 seconds.  Fastest episode 145 bpm.    Tiffany C. Raford, MD, [facility] 01/25/2024 8:50 AM   CT SCANS  CT CORONARY MORPH W/CTA COR W/SCORE 12/14/2023  Addendum 12/15/2023 11:36 AM ADDENDUM REPORT: 12/15/2023 11:34  EXAM: OVER-READ INTERPRETATION  CT CHEST  The following report is an over-read performed by radiologist Dr. Manford Breaker [facility] on 12/15/2023. This over-read does not include interpretation of cardiac or coronary anatomy or pathology. The coronary CTA interpretation by the cardiologist is attached.  COMPARISON:  CT cardiac dated 11/22/2019  FINDINGS: Cardiovascular: Normal appearance of extracardiac  vascular structures.  Mediastinum/Nodes: Small hiatal hernia. No pathologically enlarged mediastinal or hilar lymph nodes.  Lungs/Pleura: The imaged central airways are patent. Unchanged 6 mm right lower lobe nodule (307:51). Triangular perifissural nodule along the right minor fissure (307:32), likely lymph node. No focal consolidation. No pneumothorax. Trace bilateral pleural effusions, right-greater-than-left.  Upper abdomen: Postsurgical changes of the stomach.  Musculoskeletal: No acute or abnormal lytic or blastic osseous lesions. Multilevel degenerative changes of the partially imaged thoracic and lumbar spine.  IMPRESSION: 1. Unchanged 6 mm right lower lobe nodule dating back to 11/22/2019, likely benign. 2. Trace bilateral pleural effusions, right-greater-than-left. 3. Small hiatal hernia.   Electronically Signed By: Limin  Xu M.D. On: 12/15/2023 11:34  Narrative CLINICAL DATA:  Aortic valve replacement (TAVR), pre-op eval  EXAM: Cardiac TAVR CT  TECHNIQUE: A non-contrast, gated CT scan was obtained with axial slices of 2.5 mm through the heart for aortic valve scoring.  A 120 kV retrospective, gated, contrast cardiac scan was obtained. Gantry rotation speed was 230 msec and collimation was 0.63 mm. Nitroglycerin  was not given. A delayed scan was obtained to exclude left atrial appendage thrombus. The 3D dataset was reconstructed in systole with motion correction. The 3D data set was reconstructed in 5% intervals of the 0-95% of the R-R cycle. Systolic and diastolic phases were analyzed on a dedicated workstation using MPR, MIP, and VRT modes. The patient received 100 cc of contrast.  FINDINGS: Aortic Valve:  Tricuspid aortic valve with severely reduced cusp excursion. Severely thickened and severely calcified aortic valve cusps.  AV calcium  score: 4184  Virtual Basal Annulus Measurements:  Maximum/Minimum Diameter: 31.1 x 23.9 mm  Perimeter: 85.9  mm  Area:  563 mm2  No significant LVOT calcifications.  Membranous septal length: 8 mm  Based on these measurements, the annulus would be suitable for a 29 mm Sapien 3 valve. Alternatively, Heart Team can consider 34 mm Evolut valve. Recommend Heart Team discussion for valve selection.  Sinus of Valsalva Measurements:  Non-coronary:  36 mm  Right - coronary:  36 mm  Left - coronary:  36 mm  Coronary height and sinus of Valsalva Height:  Left main: 19 mm, Left sinus: 24 mm  Right coronary: 19 mm, Right sinus: 25 mm  Aorta: Conventional 3 vessel branch pattern of aortic arch. Branch vessel tortuosity.  Sinotubular Junction:  31 mm  Ascending Thoracic Aorta:  36 mm  Aortic Arch:  33 mm  Descending Thoracic Aorta:  27 mm  Coronary Arteries: Normal coronary origin. Right dominance. The study was performed without use of NTG and insufficient for plaque evaluation. Coronary artery calcium  seen in 3 vessel distribution.  Optimum Fluoroscopic Angle for Delivery: RAO 7 CRA 3  OTHER:  Atria: Mild LA dilation.  Probable small PFO.  Left atrial appendage: No thrombus.  Mitral valve: moderate mitral annular calcifications.  Pulmonary artery: Moderate-severe dilation, 40 mm, suggests pulmonary hypertension.  Pulmonary veins: Normal anatomy.  Trivial pericardial effusion.  IMPRESSION: 1. Tricuspid aortic valve with severely reduced cusp excursion. Severely thickened and severely calcified aortic valve cusps. 2. Aortic valve calcium  score: 4184 3. Annulus area: 563 mm2, suitable for 29 mm Sapien 3 valve. No LVOT calcifications. Membranous septal length 8 mm. 4. Sufficient coronary artery heights from annulus. 5. Optimum fluoroscopic angle for delivery: RAO 7 CRA 3  Electronically Signed: By: Soyla Merck M.D. On: 12/14/2023 17:08   CT SCANS  CT CARDIAC SCORING (SELF PAY ONLY) 11/22/2019  Narrative CLINICAL DATA:  Hyperlipidemia.  Family history of  heart disease.  EXAM: CT CARDIAC CORONARY ARTERY CALCIUM  SCORE  TECHNIQUE: Non-contrast imaging through the heart was performed using prospective ECG gating. Image post processing was performed on an independent workstation, allowing for quantitative analysis of the heart and coronary arteries. Note that this exam targets the heart and the chest was not imaged in its entirety.  COMPARISON:  10/10/2009 chest CT.  FINDINGS: CORONARY CALCIUM  SCORES:  Left Main: 0  LAD: 95.7  LCx: 17.9  RCA: 0  Total Agatston Score: 114  MESA database percentile: 59th  AORTA MEASUREMENTS:  Ascending Aorta: 31 mm  Descending Aorta: 30 mm  OTHER FINDINGS:  No pleural fluid. Nodule along the right minor fissure on 33/9 is similar to on the prior and can be seen presumed a benign subpleural lymph node.  A right lower lobe 5 mm nodule on 58/9 is also similar in 2011 and presumed benign.  Aortic  atherosclerosis.  Tortuous thoracic aorta.  Normal heart size.  Aortic valve calcification.  Pulmonary artery enlargement, outflow tract 3.4 cm  No imaged thoracic adenopathy. Surgical changes at the gastroesophageal junction. Esophageal fluid level on 24/3.  Normal imaged portions of the liver, spleen.  Osteopenia with moderate thoracic spondylosis.  IMPRESSION: 1. Total Agatston score of 114, corresponding to 59th percentile for age, sex, and race based cohort. 2. Pulmonary artery enlargement suggests pulmonary arterial hypertension. 3. Esophageal air fluid level suggests dysmotility or gastroesophageal reflux. 4. Aortic Atherosclerosis (ICD10-I70.0). 5. Aortic valvular calcifications. Consider echocardiography to evaluate for valvular dysfunction.   Electronically Signed By: Rockey Kilts M.D. On: 11/22/2019 14:00     ______________________________________________________________________________________________      EKG:        Recent Labs: 12/01/2023: Pro Brain  Natriuretic Peptide 7,643.0 12/25/2023: ALT 16 12/30/2023: BUN 13; Creatinine, Ser 0.68; Hemoglobin 9.6; Magnesium  1.9; Platelets 156; Potassium 3.9; Sodium 142  Recent Lipid Panel    Component Value Date/Time   CHOL 133 10/12/2023 1120   TRIG 69 10/12/2023 1120   HDL 51 10/12/2023 1120   CHOLHDL 2.6 10/12/2023 1120   CHOLHDL 2.8 08/13/2023 0350   VLDL 14 08/13/2023 0350   LDLCALC 68 10/12/2023 1120     Risk Assessment/Calculations:    CHA2DS2-VASc Score = 6   This indicates a 9.7% annual risk of stroke. The patient's score is based upon: CHF History: 1 HTN History: 0 Diabetes History: 0 Stroke History: 2 Vascular Disease History: 0 Age Score: 2 Gender Score: 1               Physical Exam:    VS:  BP 120/64 (BP Location: Right Arm, Patient Position: Sitting, Cuff Size: Normal)   Pulse 84   Ht 5' 5 (1.651 m)   Wt 171 lb (77.6 kg)   SpO2 96%   BMI 28.46 kg/m     Wt Readings from Last 3 Encounters:  02/10/24 171 lb (77.6 kg)  01/04/24 173 lb 6.4 oz (78.7 kg)  12/29/23 171 lb (77.6 kg)     GEN:  Well nourished, well developed in no acute distress HEENT: Normal NECK: No JVD; No carotid bruits LYMPHATICS: No lymphadenopathy CARDIAC: RRR, no murmurs, rubs, gallops RESPIRATORY:  Clear to auscultation without rales, wheezing or rhonchi  ABDOMEN: Soft, non-tender, non-distended MUSCULOSKELETAL:  No edema; No deformity  SKIN: Warm and dry NEUROLOGIC:  Alert and oriented x 3 PSYCHIATRIC:  Normal affect   Assessment & Plan S/P TAVR (transcatheter aortic valve replacement) 30 day echo shows improved LV function and normal TAVR valve function with LVEF 50-55%, normal RV function, and mean transaortic gradient 11 mmHg with no PVL. Pt reports NYHA functional class 1 limitation.  Needs to continue to follow SBE prophylaxis when indicated.  Will arrange a 71-month follow-up with Dr. Raford and we will plan to see her back in valve clinic for her 1 year echocardiogram  and office visit per protocol. HFrEF (heart failure with reduced ejection fraction) (HCC) LVEF improved as above.  Initially with LVEF 30%, improved to 50 to 55% status post TAVR.  Continue furosemide  at current dose. Primary hypertension Blood pressure well-controlled on current medical regimen.  Continue losartan .     Cardiac Rehabilitation Eligibility Assessment  The patient is ready to start cardiac rehabilitation from a cardiac standpoint.          Medication Adjustments/Labs and Tests Ordered: Current medicines are reviewed at length with the patient today.  Concerns regarding medicines  are outlined above.  Orders Placed This Encounter  Procedures   ECHOCARDIOGRAM COMPLETE   No orders of the defined types were placed in this encounter.   Patient Instructions  Medication Instructions:  No medication changes were made at this visit. Continue current regimen.   *If you need a refill on your cardiac medications before your next appointment, please call your pharmacy*  Lab Work: None ordered today. If you have labs (blood work) drawn today and your tests are completely normal, you will receive your results only by: MyChart Message (if you have MyChart) OR A paper copy in the mail If you have any lab test that is abnormal or we need to change your treatment, we will call you to review the results.  Testing/Procedures: Your physician has requested that you have an echocardiogram to be completed in 1 year (prior to structural heart follow-up with Izetta Hummer, PA-C). Echocardiography is a painless test that uses sound waves to create images of your heart. It provides your doctor with information about the size and shape of your heart and how well your hearts chambers and valves are working. This procedure takes approximately one hour. There are no restrictions for this procedure. Please do NOT wear cologne, perfume, aftershave, or lotions (deodorant is allowed). Please  arrive 15 minutes prior to your appointment time.  Please note: We ask at that you not bring children with you during ultrasound (echo/ vascular) testing. Due to room size and safety concerns, children are not allowed in the ultrasound rooms during exams. Our front office staff cannot provide observation of children in our lobby area while testing is being conducted. An adult accompanying a patient to their appointment will only be allowed in the ultrasound room at the discretion of the ultrasound technician under special circumstances. We apologize for any inconvenience.   Follow-Up: At [facility], you and your health needs are our priority.  As part of our continuing mission to provide you with exceptional heart care, our providers are all part of one team.  This team includes your primary Cardiologist (physician) and Advanced Practice Providers or APPs (Physician Assistants and Nurse Practitioners) who all work together to provide you with the care you need, when you need it.  Your next appointment:   3 month(s) with Dr. Raford  1 year with Izetta Hummer, PA-C   Signed, Ozell Fell, MD  02/10/2024 10:56 AM    [facility]     [1]  Current Meds  Medication Sig   amoxicillin  (AMOXIL ) 500 MG tablet Take 4 tablets (2,000 mg total) by mouth as directed. 1 hour prior to dental work including cleanings   aspirin  EC 81 MG tablet Take 1 tablet (81 mg total) by mouth daily. Swallow whole.   atorvastatin  (LIPITOR) 20 MG tablet Take 1 tablet (20 mg total) by mouth daily.   cyanocobalamin  (,VITAMIN B-12,) 1000 MCG/ML injection Inject 1,000 mcg into the skin every 30 (thirty) days.    fluticasone  (FLONASE ) 50 MCG/ACT nasal spray Place 2 sprays into both nostrils daily. 2 sprays each nostril at night (Patient taking differently: Place 2 sprays into both nostrils daily as needed for allergies. 2 sprays each nostril at night)   furosemide  (LASIX ) 40 MG tablet Take 1 tablet (40  mg total) by mouth daily.   hydroxypropyl methylcellulose / hypromellose (ISOPTO TEARS / GONIOVISC) 2.5 % ophthalmic solution Place 1 drop into both eyes 4 (four) times daily as needed for dry eyes.   losartan  (COZAAR ) 25  MG tablet Take 1 tablet (25 mg total) by mouth daily.   Multiple Vitamin (MULTIVITAMIN WITH MINERALS) TABS tablet Take 1 tablet by mouth daily.   triamcinolone  (NASACORT ) 55 MCG/ACT AERO nasal inhaler Place 2 sprays into the nose daily. 2 sprays each nostril at night (Patient taking differently: Place 2 sprays into the nose daily as needed. 2 sprays each nostril at night)   "

## 2024-02-10 NOTE — Assessment & Plan Note (Signed)
 Blood pressure well-controlled on current medical regimen.  Continue losartan .

## 2024-02-11 ENCOUNTER — Encounter: Attending: Psychology

## 2024-02-11 DIAGNOSIS — Z8673 Personal history of transient ischemic attack (TIA), and cerebral infarction without residual deficits: Secondary | ICD-10-CM | POA: Insufficient documentation

## 2024-02-11 DIAGNOSIS — R4189 Other symptoms and signs involving cognitive functions and awareness: Secondary | ICD-10-CM | POA: Insufficient documentation

## 2024-02-11 NOTE — Progress Notes (Signed)
 "  Mental Status/Behavioral Observations (02/11/2024):  Orientation: The patient was oriented to self with the exception of her age (said 81 instead of 19). She was oriented to place but not to time (gave incorrect day of month). Sensory/Arousal: Hearing and vision were adequate for the demands of testing. The patient was alert. Appearance: Dress and hygiene were appropriate for the setting.  Speech/Language: In conversation, the patient's speech was prosodic, fluent, and well-articulated. The patient displayed no indications of word finding difficulties and no word subtitution errors were observed.  Motor: The patient ambulated independently and without issue. No tremors were observed.  Social Comportment: Social behavior was appropriate for the setting. Mood/Affect: Mood was largely neutral to positive. Affect was congruent with mood.  Attention/Concentration: The patient appeared to maintain consistent engagement throughout the testing session. No frank attentional lapses were observed.  Thought Process/Content: The patient's thought process was coherent, linear, goal directed. There were no indications of psychosis.  Additional Observations: The patient showed no difficulties with understanding task instructions. Minimal difficulties with frustration tolerance were noted.  Neuropsychology Note Julia Bowman completed 100 minutes of neuropsychological testing with technician, Josue Ned, BA, under the supervision of Evalene Riff, PsyD., Clinical Neuropsychologist. The patient did not appear overtly distressed by the testing session, per behavioral observation or via self-report to the technician. Rest breaks were offered.   Clinical Decision Making: In considering the patient's current level of functioning, level of presumed impairment, nature of symptoms, emotional and behavioral responses during clinical interview, level of literacy, and observed level of motivation/effort, a battery of  tests was selected by Dr. Riff during initial consultation on 01/26/2024. This was communicated to the technician. Communication between the neuropsychologist and technician was ongoing throughout the testing session and changes were made as deemed necessary based on patient performance on testing, technician observations and additional pertinent factors such as those listed above.  Tests Administered: Automatic Data Edition (BNT-2) Brief Visuospatial Memory Test-Revised (BVMT-R) Clock Drawing Test Controlled Oral Word Association Test (FAS & Animals) Delis-Kaplan Executive Function System (D-KEFS), select subtests Hopkins Verbal Learning Test - Revised (HVLT-R) Repeatable Battery for the Assessment of Neuropsychological Status Update (RBANS) Trail Making Test (TMT; Part A & B) Wechsler Adult Intelligence Scale-Fourth Edition (WAIS-IV), select subtests Wechsler Memory Scale-Fourth Edition (WMS-IV) , select subtests Wechsler Memory Scale-Third Edition (WMS-III), select subtests  Wechsler Test of Adult Reading (WTAR) Geriatric Depression Scale-Short Form (GDS-SF) Geriatric Anxiety Inventory (GAI)  NEUROPSYCHOLOGICAL TEST RESULTS: Note: This summary of test scores accompanies the interpretive report and should not be interpreted by unqualified individuals or in isolation without reference to the report.   Test scores are relative to age and further adjusted for educational history and demographics as available when appropriate.  Measurement properties of test scores: IQ, Index, and Standard Scores (SS): Mean = 100; Standard Deviation = 15; Scaled Scores (ss): Mean = 10; Standard Deviation = 3; Z scores (Z): Mean = 0; Standard Deviation = 1; T scores (T); Mean = 50; Standard Deviation = 10  Intellectual/Premorbid Functioning Estimate   Norm Score Percentile  Range  Wechsler Test of Adult Reading  SS = 122 93 %ile Above Average   ATTENTION AND WORKING MEMORY    Norm Score Percentile   Range  WAIS-IV          Digit Span  ss = 12 75 %ile High Average   DSF  ss = 10 50 %ile Average   Span:    7  DSB  ss = 10 50 %ile Average   Span:    4      DSS  ss = 13 84 %ile High Average   Span:    6     WMS-III          Spatial Span  ss = 12 75 %ile High Average   SSF  ss = 12 75 %ile High Average   Span:    6      SSB  ss = 12 75 %ile High Average   Span:    5      PROCESSING SPEED    Norm Score Percentile  Range  WAIS-IV          Coding  ss = 7 16 %ile Low Average   Symbol Search  ss = 8 25 %ile Average   LANGUAGE    Norm Score Percentile  Range  Boston Naming Test (BNT-2)  T = 50 50 %ile Average  COWAT          FAS  T = 37 9 %ile Low Average   Animals  T = 38 12 %ile Low Average   EXECUTIVE FUNCTIONING    Norm Score Percentile  Range  DKEFS - Color-Word Interference          Color Naming  ss = 8 25 %ile Average   Word Reading  ss = 7 16 %ile Low Average   Inhibition  ss = 8 25 %ile Average   Errors  ss = 9 37 %ile Average   Inhibition Switching  ss = 3 1 %ile Exceptionally Low   Errors  ss = 1 0.1 %ile Exceptionally Low  TMT A  T = 30 2 %ile Below Average  TMT B  T = 43 25 %ile Average   MEMORY    Norm Score Percentile  Range  BVMT-R          Trial 1  T = 62.0 88 %ile High Average   Trial 2  T = 45 30 %ile Average   Trial 3  T = 40.0 16 %ile Low Average   Total Recall  T = 48.0 42 %ile Average   Learning  T = <20 <0.1 %ile Exceptionally Low   Delayed Recall  T = 43.0 25 %ile Average   % Retained    100 >16 %ile WNL   Hits     >16 %ile WNL   False Alarms     >16 %ile WNL   Recognition Discriminability     >16 %ile WNL  HVLT          Total Recall  T = 36.0 8 %ile Below Average   Delayed Recall  T = 38 12 %ile Low Average   %Retention  T = 48.0 42 %ile Average   Recognition Discriminability  T = 48.0 42 %ile Average  Wechsler Memory Scale, 4th Edition (WMS-4)         Log. Mem. Immediate Recall  ss = 10 50 %ile Average   Logical Memory Delayed Recall   ss = 7 16 %ile Low Average   Logical Recognition    26th-50th  %ile Average   VISUAL-SPATIAL    Norm Score Percentile  Range  Clock                   RBANS Visuospatial Index          RBANS Figure Copy  ss = 9 37 %ile Average  RBANS Line Orientation      17th-25th  %ile Average to Low Average   PERSONALITY AND BEHAVIORAL FUNCTIONING      Score/Interpretation  GDS-SF Raw       1  GDS-SF Severity       Minimal.  GAI Raw       0  GAI Severity       Minimal.     Feedback to Patient: Julia Bowman will return on 02/23/2024 for an interactive feedback session with Dr. Hayden at which time her test performances, clinical impressions and treatment recommendations will be reviewed in detail. The patient understands she can contact our office should she require our assistance before this time.  100 minutes spent face-to-face with patient administering standardized tests, 45 minutes spent scoring radiographer, therapeutic). [CPT [id number], [id number]]  Full report to follow. "

## 2024-02-17 ENCOUNTER — Encounter: Admitting: Psychology

## 2024-02-19 ENCOUNTER — Telehealth (HOSPITAL_COMMUNITY): Payer: Self-pay

## 2024-02-19 NOTE — Telephone Encounter (Signed)
 F/u call regarding cardiac rehab, spoke to her caretaker. Aside from transportation issues, Julia Bowman is not currently mentally able to take her medications properly and they believe she may be diagnosed with dementia this coming week. She stated it would not be a good fit for [facility] at this time. Informed her if anything changes they can call us  back.  Closing referral.

## 2024-02-23 ENCOUNTER — Encounter: Admitting: Psychology

## 2024-03-01 ENCOUNTER — Encounter: Attending: Psychology | Admitting: Psychology

## 2024-03-01 ENCOUNTER — Encounter: Payer: Self-pay | Admitting: Psychology

## 2024-05-16 ENCOUNTER — Ambulatory Visit (HOSPITAL_BASED_OUTPATIENT_CLINIC_OR_DEPARTMENT_OTHER): Admitting: Cardiovascular Disease

## 2025-02-06 ENCOUNTER — Ambulatory Visit (HOSPITAL_COMMUNITY)

## 2025-02-06 ENCOUNTER — Ambulatory Visit: Admitting: Physician Assistant
